# Patient Record
Sex: Male | Born: 1985
Health system: Southern US, Community
[De-identification: ages and names within clinical notes are randomized; demographics above are authoritative.]

---

## 2010-10-06 ENCOUNTER — Ambulatory Visit (INDEPENDENT_AMBULATORY_CARE_PROVIDER_SITE_OTHER): Payer: Self-pay | Admitting: Internal Medicine

## 2017-10-06 ENCOUNTER — Other Ambulatory Visit: Payer: Self-pay

## 2017-10-06 ENCOUNTER — Encounter: Payer: Self-pay | Admitting: Family Medicine

## 2017-10-06 ENCOUNTER — Other Ambulatory Visit (HOSPITAL_COMMUNITY)
Admission: RE | Admit: 2017-10-06 | Discharge: 2017-10-06 | Disposition: A | Discharge status: Home / Self Care | Payer: No Typology Code available for payment source | Source: Ambulatory Visit | Attending: Family Medicine | Admitting: Family Medicine

## 2017-10-06 ENCOUNTER — Ambulatory Visit (INDEPENDENT_AMBULATORY_CARE_PROVIDER_SITE_OTHER): Payer: No Typology Code available for payment source | Admitting: Family Medicine

## 2017-10-06 VITALS — BP 142/70 | HR 66 | Temp 98.7°F | Resp 16 | Ht 67.0 in | Wt 159.2 lb

## 2017-10-06 DIAGNOSIS — Z0000 Encounter for general adult medical examination without abnormal findings: Secondary | ICD-10-CM

## 2017-10-06 DIAGNOSIS — Z113 Encounter for screening for infections with a predominantly sexual mode of transmission: Secondary | ICD-10-CM | POA: Diagnosis not present

## 2017-10-06 DIAGNOSIS — R030 Elevated blood-pressure reading, without diagnosis of hypertension: Secondary | ICD-10-CM

## 2017-10-06 DIAGNOSIS — J40 Bronchitis, not specified as acute or chronic: Secondary | ICD-10-CM | POA: Diagnosis not present

## 2017-10-06 DIAGNOSIS — Z23 Encounter for immunization: Secondary | ICD-10-CM | POA: Diagnosis not present

## 2017-10-06 DIAGNOSIS — Z114 Encounter for screening for human immunodeficiency virus [HIV]: Secondary | ICD-10-CM | POA: Diagnosis not present

## 2017-10-06 MED ORDER — AZITHROMYCIN 250 MG PO TABS: ORAL_TABLET | ORAL | 0 refills | Status: DC

## 2017-10-06 NOTE — Progress Notes (Signed)
Patient ID: Henry Castro, male    DOB: 01/25/1986, 32 y.o.   MRN: [id number]  Chief Complaint  Patient presents with  . Annual Exam    Allergies Patient has no known allergies.  Subjective:   Henry Castro is a 32 y.o. male who presents to [facility] today.  HPI Here to establish care new patient to get his complete physical exam.  He reports that he feels like he is in a good state of health.  Exercises and eats healthy.  He does report that he has been coughing for one month. No weight loss, no night sweats, no SOB. Cough worse over the past week.  Has had an associated sore throat.  Seems to make the cough better.  He does report that the cough is worse at night.  He denies any chest pain or shortness of breath.  Denies any reflux or dyspepsia.  Has no history of asthma.  Does exercise at the gym quite a bit and is around sick contacts from time to time.  Has no history of pneumonia.  The cough has not gone away.  He does not remember when he was last vaccinated for tetanus or pertussis.  Moved from [place] in 2001.   Goes to the dentist every 6 months.  Has not had an eye exam in approximately 4 years.  Has no history of diabetes, high cholesterol or high blood pressure.  Blood pressure is elevated today.  He denies any use of over-the-counter cough medicines.  He does report that he drinks energy drinks and pre-workout enhancement supplements.  He reports he is trying to gain some weight and increase his muscle mass.  He reports that it is hard for him to gain weight and would like to bulk up.  He enjoys working out.  Enjoys time with his wife and family.  Is not currently employed because he quit his job at a [employer] plant because he did not like it and it was an hour drive.    History reviewed. No pertinent past medical history.  History reviewed. No pertinent surgical history.  Family History  Problem Relation Age of Onset  . Hyperlipidemia Mother   . Hypertension  Mother   . Pneumonia Father      Social History   Socioeconomic History  . Marital status: Married    Spouse name: None  . Number of children: None  . Years of education: None  . Highest education level: None  Social Needs  . Financial resource strain: None  . Food insecurity - worry: None  . Food insecurity - inability: None  . Transportation needs - medical: None  . Transportation needs - non-medical: None  Occupational History  . None  Tobacco Use  . Smoking status: Former Smoker    Years: 1.00  . Smokeless tobacco: Never Used  Substance and Sexual Activity  . Alcohol use: Yes    Comment: less than once a week  . Drug use: No  . Sexual activity: Yes    Partners: Female    Birth control/protection: Condom  Other Topics Concern  . 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    Live in [place], [place].    Married with 2 children, 9 and 6.    Eats all food groups.   Wear seatbelt.   Exercises by weight lifting.   Work out at [employer].  Review of Systems  Constitutional: Negative for activity change, appetite change, chills, diaphoresis, fatigue, fever and unexpected weight change.  HENT: Positive for sore throat. Negative for dental problem, mouth sores, nosebleeds, rhinorrhea, sinus pressure, sinus pain, sneezing, tinnitus, trouble swallowing and voice change.   Respiratory: Positive for cough. Negative for apnea, choking, chest tightness, shortness of breath, wheezing and stridor.   Gastrointestinal: Negative for abdominal pain, blood in stool, constipation, diarrhea, nausea and vomiting.  Genitourinary: Negative for decreased urine volume, difficulty urinating, dysuria, flank pain, frequency, genital sores, hematuria, testicular pain and urgency.  Musculoskeletal: Negative for arthralgias, back pain, gait problem and neck stiffness.  Skin: Negative for pallor and rash.  Neurological: Negative for dizziness,  tremors, syncope, weakness, light-headedness, numbness and headaches.  Hematological: Negative for adenopathy. Does not bruise/bleed easily.  Psychiatric/Behavioral: Negative for agitation, behavioral problems, dysphoric mood and sleep disturbance. The patient is not nervous/anxious.      Objective:   BP (!) 142/70 (BP Location: Left Arm, Patient Position: Sitting, Cuff Size: Normal)   Pulse 66   Temp 98.7 F (37.1 C) (Temporal)   Resp 16   Ht 5\' 7"  (1.702 m)   Wt 159 lb 4 oz (72.2 kg)   SpO2 99%   BMI 24.94 kg/m   Physical Exam  Constitutional: He is oriented to person, place, and time. He appears well-developed and well-nourished. No distress.  HENT:  Head: Normocephalic and atraumatic.  Right Ear: External ear normal.  Left Ear: External ear normal.  Nose: Nose normal.  Mouth/Throat: Oropharynx is clear and moist. No oropharyngeal exudate.  3-4 mm flesh-colored pedunculated skin lesion in right nasal canal.  Eyes: Conjunctivae and EOM are normal. Pupils are equal, round, and reactive to light. No scleral icterus.  Neck: Normal range of motion. Neck supple. No JVD present. No tracheal deviation present. No thyromegaly present.  Cardiovascular: Normal rate, regular rhythm, normal heart sounds and intact distal pulses.  No murmur heard. Pulses:      Dorsalis pedis pulses are 2+ on the right side, and 2+ on the left side.  Pulmonary/Chest: Effort normal and breath sounds normal.  Abdominal: Soft. Bowel sounds are normal. He exhibits no distension. There is no tenderness.  Musculoskeletal: Normal range of motion. He exhibits no edema.  Lymphadenopathy:    He has no cervical adenopathy.  Neurological: He is alert and oriented to person, place, and time. He displays normal reflexes. No cranial nerve deficit or sensory deficit. He exhibits normal muscle tone. Coordination normal.  Skin: Skin is warm, dry and intact. Capillary refill takes less than 2 seconds. No rash noted. He is  not diaphoretic. No erythema.  Psychiatric: He has a normal mood and affect. His behavior is normal. Judgment and thought content normal.  Vitals reviewed.    Assessment and Plan  1. Well adult exam Age-appropriate anticipatory guidance was discussed with patient today.  We did discuss his use of protein supplements and energy drinks.  It was recommended that he discontinue the supplements and the energy drinks at this time due to his blood pressure being elevated today.  We did discuss that very high levels of protein intake could be harder on renal function.  We discussed eating real food diet that is healthy and eating lean meats and proteins. - CBC with Differential/Platelet - Lipid panel - TSH - COMPLETE METABOLIC PANEL WITH GFR  2. Immunization due Vaccinations given today. - Flu Vaccine QUAD 6+ mos PF IM (Fluarix Quad PF) - Tdap vaccine greater  than or equal to 7yo IM  3. Bronchitis Due to the fact the patient has had a cough for a month, we will treat for bronchitis at this time.  Did discuss with patient need to make sure the cough resolves.  He was also told to call with any questions concerns, or worrisome symptoms. - azithromycin (ZITHROMAX) 250 MG tablet; Take two pills today and one pill a day for the next four days.  Dispense: 6 tablet; Refill: 0  4. Screening for HIV (human immunodeficiency virus) But patient requested screening for HIV, hepatitis and any sexually transmitted infections.  He denies any symptoms of these infections at this time. - HIV antibody (with reflex)  5.  Elevated blood pressure, without diagnosis of hypertension. We will plan to check UA, BMP, and CBC at this time.  Did discuss with patient I would recommend he eating a diet low in salt and to discontinue use of protein supplements and energy drinks which could be elevating blood pressure.  I told him to check his supplements and see if there were stimulant-containing or caffeine containing which  could be elevating his blood pressure. - Urinalysis, Routine w reflex microscopic Follow-up in 1 month for recheck of blood pressure. 6. Routine screening for STI (sexually transmitted infection) Testing performed today. - HIV antibody - RPR - Hepatitis panel, acute - Urine cytology ancillary only  Dentist every six months. Vision exam recommended.  Return in about 4 weeks (around 11/03/2017) for BP check. Aliene Beams, MD 10/06/2017

## 2017-10-06 NOTE — Patient Instructions (Signed)
Plan de alimentación DASH  DASH Eating Plan  DASH es la sigla en inglés de "Enfoques Alimentarios para Detener la Hipertensión" (Dietary Approaches to Stop Hypertension). El plan de alimentación DASH ha demostrado bajar la presión arterial elevada (hipertensión). También puede reducir el riesgo de diabetes tipo 2, enfermedad cardíaca y accidente cerebrovascular. Este plan también puede ayudar a adelgazar.  Consejos para seguir este plan  Pautas generales  · Evite ingerir más de 2,300 mg (miligramos) de sal (sodio) por día. Si tiene hipertensión, es posible que necesite reducir la ingesta de sodio a 1,500 mg por día.  · Limite el consumo de alcohol a no más de 1 medida por día si es mujer y no está embarazada, y 2 medidas por día si es hombre. Una medida equivale a 12 oz (355 ml) de cerveza, 5 oz (148 ml) de vino o 1½ oz (44 ml) de bebidas alcohólicas de alta graduación.  · Trabaje con su médico para mantener un peso saludable o perder peso. Pregúntele cuál es el peso recomendado para usted.  · Realice al menos 30 minutos de ejercicio que haga que se acelere su corazón (ejercicio aeróbico) la mayoría de los días de la semana. Estas actividades pueden incluir caminar, nadar o andar en bicicleta.  · Trabaje con su médico o especialista en alimentación y nutrición (nutricionista) para ajustar su plan alimentario a sus necesidades calóricas personales.  Lectura de las etiquetas de los alimentos  · Verifique en las etiquetas de los alimentos, la cantidad de sodio por porción. Elija alimentos con menos del 5 por ciento del valor diario de sodio. Generalmente, los alimentos con menos de 300 mg de sodio por porción se encuadran dentro de este plan alimentario.  · Para encontrar cereales integrales, busque la palabra "integral" como primera palabra en la lista de ingredientes.  De compras  · Compre productos en los que en su etiqueta diga: “bajo contenido de sodio” o “sin agregado de sal”.   · Compre alimentos frescos. Evite los alimentos enlatados y comidas precocidas o congeladas.  Cocción  · Evite agregar sal cuando cocine. Use hierbas o aderezos sin sal, en lugar de sal de mesa o sal marina. Consulte al médico o farmacéutico antes de usar sustitutos de la sal.  · No fría los alimentos. A la hora de cocinar los alimentos opte por hornearlos, hervirlos, grillarlos y asarlos a la parrilla.  · Cocine con aceites cardiosaludables, como oliva, canola, soja o girasol.  Planificación de las comidas    · Consuma una dieta equilibrada, que incluya lo siguiente:  ? 5 o más porciones de frutas y verduras por día. Trate de que la mitad del plato de cada comida sean frutas y verduras.  ? Hasta 6 u 8 porciones de cereales integrales por día.  ? Menos de 6 onzas de carne, aves o pescado magros por día. Una porción de 3 onzas de carne tiene casi el mismo tamaño que un mazo de cartas. Un huevo equivale a 1 onza.  ? Dos porciones de productos lácteos descremados por día.  ? Una porción de frutos secos, semillas o frijoles 5 veces por semana.  ? Grasas cardiosaludables. Las grasas saludables llamadas ácidos grasos omega-3 se encuentran en alimentos como semillas de lino y pescados de agua fría, como por ejemplo, sardinas, salmón y caballa.  · Limite la cantidad que ingiere de los siguientes alimentos:  ? Alimentos enlatados o envasados.  ? Alimentos con alto contenido de grasa trans, como alimentos fritos.  ? Alimentos con alto contenido de grasa saturada, como carne con   grasa.  ? Dulces, postres, bebidas azucaradas y otros alimentos con azúcar agregada.  ? Productos lácteos enteros.  · No le agregue sal a los alimentos antes de probarlos.  · Trate de comer al menos 2 comidas vegetarianas por semana.  · Consuma más comida casera y menos de restaurante, de bufés y comida rápida.  · Cuando coma en un restaurante, pida que preparen su comida con menos sal o, en lo posible, sin nada de sal.  ¿Qué alimentos se recomiendan?   Los alimentos enumerados a continuación no constituyen una lista completa. Hable con el nutricionista sobre las mejores opciones alimenticias para usted.  Cereales  Pan de salvado o integral. Pasta de salvado o integral. Arroz integral. Avena. Quinua. Trigo burgol. Cereales integrales y con bajo contenido de sodio. Pan pita. Galletitas de agua con bajo contenido de grasa y sodio. Tortillas de harina integral.  Verduras  Verduras frescas o congeladas (crudas, al vapor, asadas o grilladas). Jugos de tomate y verduras con bajo contenido de sodio o reducidos en sodio. Salsa y pasta de tomate con bajo contenido de sodio o reducidas en sodio. Verduras enlatadas con bajo contenido de sodio o reducidas en sodio.  Frutas  Todas las frutas frescas, congeladas o disecadas. Frutas enlatadas en jugo natural (sin agregado de azúcar).  Carne y otros alimentos proteicos  Pollo o pavo sin piel. Carne de pollo o de pavo molida. Cerdo desgrasado. Pescado y mariscos. Claras de huevo. Porotos, guisantes o lentejas secos. Frutos secos, mantequilla de frutos secos y semillas sin sal. Frijoles enlatados sin sal. Cortes de carne vacuna magra, desgrasada. Embutidos magros, con bajo contenido de sodio.  Lácteos  Leche descremada (1 %) o descremada. Quesos sin grasa, con bajo contenido de grasa o descremados. Queso blanco o ricota sin grasa, con bajo contenido de sodio. Yogur semidescremado o descremado. Queso con bajo contenido de grasa y sodio.  Grasas y aceites  Margarinas untables que no contengan grasas trans. Aceite vegetal. Mayonesa y aderezos para ensaladas livianos o con bajo contenido de grasas (reducidos en sodio). Aceite de canola, cártamo, oliva, soja y girasol. Aguacate.  Condimentos y otros alimentos  Hierbas. Especias. Mezclas de condimentos sin sal. Palomitas de maíz y pretzels sin sal. Dulces con bajo contenido de grasas.  ¿Qué alimentos no se recomiendan?   Los alimentos enumerados a continuación no constituyen una lista completa. Hable con el nutricionista sobre las mejores opciones alimenticias para usted.  Cereales  Productos de panificación hechos con grasa, como medialunas, magdalenas y algunos panes. Comidas con arroz o pasta seca listas para usar.  Verduras  Verduras con crema o fritas. Verduras en salsa de queso. Verduras enlatadas regulares (que no sean con bajo contenido de sodio o reducidas en sodio). Pasta y salsa de tomates enlatadas regulares (que no sean con bajo contenido de sodio o reducidas en sodio). Jugos de tomate y verduras regulares (que no sean con bajo contenido de sodio o reducidos en sodio). Pepinillos. Aceitunas.  Frutas  Fruta enlatada en almíbar liviano o espeso. Frutas cocidas en aceite. Frutas con salsa de crema o manteca.  Carne y otros alimentos proteicos  Cortes de carne con grasa. Costillas. Carne frita. Tocino. Salchichas. Mortadela y otras carnes procesadas. Salame. Panceta. Perros calientes (hotdogs). Salchicha de cerdo. Frutos secos y semillas con sal. Frijoles enlatados con agregado de sal. Pescado enlatado o ahumado. Huevos enteros o yemas. Pollo o pavo con piel.  Lácteos  Leche entera o al 2 %, crema y mitad leche y mitad crema. Queso crema entero   o con toda su grasa. Yogur entero o endulzado. Quesos con toda su grasa. Sustitutos de cremas no lácteas. Coberturas batidas. Quesos para untar y quesos procesados.  Grasas y aceites  Mantequilla. Margarina en barra. Manteca de cerdo. Materia grasa. Mantequilla clarificada. Grasa de panceta. Aceites tropicales como aceite de coco, palmiste o palma.  Condimentos y otros alimentos  Palomitas de maíz y pretzels con sal. Sal de cebolla, sal de ajo, sal condimentada, sal de mesa y sal marina. Salsa Worcestershire. Salsa tártara. Salsa barbacoa. Salsa teriyaki. Salsa de soja, incluso la que tiene contenido reducido de sodio. Salsa de carne. Salsas en lata y  envasadas. Salsa de pescado. Salsa de ostras. Salsa rosada. Rábano picante envasado. Kétchup. Mostaza. Saborizantes y tiernizantes para carne. Caldo en cubitos. Salsa picante y salsa tabasco. Escabeches envasados o ya preparados. Aderezos para tacos prefabricados o envasados. Salsas. Aderezos comunes para ensalada.  Dónde encontrar más información:  · Instituto Nacional del Coraz[place], los Pulmones y la Sangre (National Heart, Lung, and Blood Institute): www.nhlbi.nih.gov  · Asociación Estadounidense del [place] (American Heart Association): www.heart.org  Resumen  · El plan de alimentación DASH ha demostrado bajar la presión arterial elevada (hipertensión). También puede reducir el riesgo de diabetes tipo 2, enfermedad cardíaca y accidente cerebrovascular.  · Con el plan de alimentación DASH, deberá limitar el consumo de sal (sodio) a 2,300 mg por día. Si tiene hipertensión, es posible que necesite reducir la ingesta de sodio a 1,500 mg por día.  · Cuando siga el plan de alimentación DASH, trate de comer más frutas frescas y verduras, cereales integrales, carnes magras, lácteos descremados y grasas cardiosaludables.  · Trabaje con su médico o especialista en alimentación y nutrición (nutricionista) para ajustar su plan alimentario a sus necesidades calóricas personales.  Esta información no tiene como fin reemplazar el consejo del médico. Asegúrese de hacerle al médico cualquier pregunta que tenga.  Document Released: 07/22/2011 Document Revised: 11/22/2016 Document Reviewed: 11/22/2016  Elsevier Interactive Patient Education © 2018 Elsevier Inc.

## 2017-10-07 ENCOUNTER — Other Ambulatory Visit (HOSPITAL_COMMUNITY)
Admission: RE | Admit: 2017-10-07 | Discharge: 2017-10-07 | Disposition: A | Discharge status: Home / Self Care | Payer: No Typology Code available for payment source | Source: Other Acute Inpatient Hospital | Attending: Family Medicine | Admitting: Family Medicine

## 2017-10-07 DIAGNOSIS — I10 Essential (primary) hypertension: Secondary | ICD-10-CM | POA: Insufficient documentation

## 2017-10-07 LAB — URINALYSIS, ROUTINE W REFLEX MICROSCOPIC
Bilirubin Urine: NEGATIVE
Glucose, UA: NEGATIVE mg/dL
HGB URINE DIPSTICK: NEGATIVE
KETONES UR: NEGATIVE mg/dL
Leukocytes, UA: NEGATIVE
Nitrite: NEGATIVE
PH: 6 (ref 5.0–8.0)
PROTEIN: NEGATIVE mg/dL
Specific Gravity, Urine: 1.012 (ref 1.005–1.030)

## 2017-10-08 LAB — CBC WITH DIFFERENTIAL/PLATELET
Basophils Absolute: 42 cells/uL (ref 0–200)
Basophils Relative: 0.6 %
EOS ABS: 329 {cells}/uL (ref 15–500)
Eosinophils Relative: 4.7 %
HCT: 46.9 % (ref 38.5–50.0)
Hemoglobin: 16.6 g/dL (ref 13.2–17.1)
Lymphs Abs: 1631 cells/uL (ref 850–3900)
MCH: 32.4 pg (ref 27.0–33.0)
MCHC: 35.4 g/dL (ref 32.0–36.0)
MCV: 91.4 fL (ref 80.0–100.0)
MPV: 10.8 fL (ref 7.5–12.5)
Monocytes Relative: 13.9 %
NEUTROS PCT: 57.5 %
Neutro Abs: 4025 cells/uL (ref 1500–7800)
PLATELETS: 192 10*3/uL (ref 140–400)
RBC: 5.13 10*6/uL (ref 4.20–5.80)
RDW: 12 % (ref 11.0–15.0)
TOTAL LYMPHOCYTE: 23.3 %
WBC: 7 10*3/uL (ref 3.8–10.8)
WBCMIX: 973 {cells}/uL — AB (ref 200–950)

## 2017-10-08 LAB — COMPLETE METABOLIC PANEL WITH GFR
AG Ratio: 1.9 (calc) (ref 1.0–2.5)
ALT: 27 U/L (ref 9–46)
AST: 32 U/L (ref 10–40)
Albumin: 4.5 g/dL (ref 3.6–5.1)
Alkaline phosphatase (APISO): 97 U/L (ref 40–115)
BILIRUBIN TOTAL: 1.6 mg/dL — AB (ref 0.2–1.2)
BUN: 21 mg/dL (ref 7–25)
CALCIUM: 9.7 mg/dL (ref 8.6–10.3)
CHLORIDE: 105 mmol/L (ref 98–110)
CO2: 23 mmol/L (ref 20–32)
Creat: 0.87 mg/dL (ref 0.60–1.35)
GFR, EST AFRICAN AMERICAN: 132 mL/min/{1.73_m2} (ref >=60)
GFR, EST NON AFRICAN AMERICAN: 114 mL/min/{1.73_m2} (ref >=60)
Globulin: 2.4 g/dL (calc) (ref 1.9–3.7)
Glucose, Bld: 99 mg/dL (ref 65–99)
Potassium: 4.2 mmol/L (ref 3.5–5.3)
Sodium: 137 mmol/L (ref 135–146)
TOTAL PROTEIN: 6.9 g/dL (ref 6.1–8.1)

## 2017-10-08 LAB — HEPATITIS PANEL, ACUTE
HEP A IGM: NONREACTIVE
Hep B C IgM: NONREACTIVE
Hepatitis B Surface Ag: NONREACTIVE
Hepatitis C Ab: NONREACTIVE
SIGNAL TO CUT-OFF: 0.08 (ref <1.00)

## 2017-10-08 LAB — LIPID PANEL
Cholesterol: 198 mg/dL (ref <200)
HDL: 55 mg/dL (ref >40)
LDL Cholesterol (Calc): 123 mg/dL (calc) — ABNORMAL HIGH
NON-HDL CHOLESTEROL (CALC): 143 mg/dL — AB (ref <130)
Total CHOL/HDL Ratio: 3.6 (calc) (ref <5.0)
Triglycerides: 98 mg/dL (ref <150)

## 2017-10-08 LAB — HIV ANTIBODY (ROUTINE TESTING W REFLEX): HIV: NONREACTIVE

## 2017-10-08 LAB — RPR: RPR Ser Ql: NONREACTIVE

## 2017-10-08 LAB — TSH: TSH: 2.22 m[IU]/L (ref 0.40–4.50)

## 2017-10-10 LAB — URINE CYTOLOGY ANCILLARY ONLY
CHLAMYDIA, DNA PROBE: NEGATIVE
NEISSERIA GONORRHEA: NEGATIVE
TRICH (WINDOWPATH): NEGATIVE

## 2017-10-13 ENCOUNTER — Encounter: Payer: Self-pay | Admitting: Family Medicine

## 2017-11-10 ENCOUNTER — Other Ambulatory Visit: Payer: Self-pay

## 2017-11-10 ENCOUNTER — Encounter: Payer: Self-pay | Admitting: Family Medicine

## 2017-11-10 ENCOUNTER — Ambulatory Visit (INDEPENDENT_AMBULATORY_CARE_PROVIDER_SITE_OTHER): Payer: No Typology Code available for payment source | Admitting: Family Medicine

## 2017-11-10 VITALS — BP 120/78 | HR 70 | Temp 98.3°F | Resp 16 | Ht 67.0 in | Wt 155.0 lb

## 2017-11-10 DIAGNOSIS — R030 Elevated blood-pressure reading, without diagnosis of hypertension: Secondary | ICD-10-CM

## 2017-11-10 DIAGNOSIS — Z23 Encounter for immunization: Secondary | ICD-10-CM | POA: Diagnosis not present

## 2017-11-10 DIAGNOSIS — E7800 Pure hypercholesterolemia, unspecified: Secondary | ICD-10-CM

## 2017-11-10 NOTE — Progress Notes (Signed)
Patient ID: Henry Castro, male    DOB: August 16, 1986, 32 y.o.   MRN: [id number]  Chief Complaint  Patient presents with  . Follow-up  . Blood Pressure Check    Allergies Patient has no known allergies.  Subjective:   Henry Castro is a 32 y.o. male who presents to [facility] today.  HPI Henry Castro presents for follow-up from his complete physical exam.  When he was last seen at his initial visit for his physical he was noted to have an elevated blood pressure.  He is here to have that rechecked.  He is also here to review his lab work.  He reports he has been concerned about his cholesterol for a while because his mother has a history of high cholesterol and high blood pressure.  He is also here to get a check on his breathing.  He reports that last time he was here he was diagnosed with bronchitis and had been coughing for 2 months.  He was treated with an antibiotic.  He reports that his cough is resolved.  He has not had any subsequent coughing episodes or problems.  He denies any shortness of breath or wheezing.  He reports that he feels great.  He reports that since leaving here and our discussion he has stopped taking any workout supplements and cut down on the protein supplements.  He exercises daily and spends a lot of time lifting weights.  He reports that he is cut down on the amount of eggs that he eats and now only eats eggs 2-3 days a week, 1/day.  He has not checked his blood pressure since he has left here.  He reports he did lose 4-5 pounds because he has been eating healthier.  He does not eat a lot of processed food or fast food.  He has been watching his salt intake.   History reviewed. No pertinent past medical history.  History reviewed. No pertinent surgical history.  Family History  Problem Relation Age of Onset  . Hyperlipidemia Mother   . Hypertension Mother   . Pneumonia Father      Social History   Socioeconomic History  . Marital status:  Married    Spouse name: Not on file  . Number of children: Not on file  . Years of education: Not on file  . Highest education level: Not on file  Occupational History  . Not on file  Social Needs  . Financial resource strain: Not on file  . Food insecurity:    Worry: Not on file    Inability: Not on file  . Transportation needs:    Medical: Not on file    Non-medical: Not on file  Tobacco Use  . Smoking status: Former Smoker    Years: 1.00  . Smokeless tobacco: Never Used  Substance and Sexual Activity  . Alcohol use: Yes    Comment: less than once a week  . Drug use: No  . Sexual activity: Yes    Partners: Female    Birth control/protection: Condom  Lifestyle  . Physical activity:    Days per week: Not on file    Minutes per session: Not on file  . Stress: Not on file  Relationships  . Social connections:    Talks on phone: Not on file    Gets together: Not on file    Attends religious service: Not on file    Active member of club or organization: Not on file  Attends meetings of clubs or organizations: Not on file    Relationship status: Not on file  Other Topics Concern  . 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  Eats all food groups. Has stopped all supplements.    Live in [place], [place]    Married with 2 children, 9 and 6.    Eats all food groups.   Wear seatbelt.   Exercises by weight lifting.   Work out at [employer].    Has seven siblings; they live in [place], [place];        Review of Systems  Constitutional: Negative for appetite change, chills, fever and unexpected weight change.  HENT: Negative for trouble swallowing and voice change.   Eyes: Negative for visual disturbance.  Respiratory: Negative for cough, chest tightness, shortness of breath and wheezing.   Cardiovascular: Negative for chest pain, palpitations and leg swelling.  Gastrointestinal: Negative for abdominal pain, diarrhea,  nausea and vomiting.  Genitourinary: Negative for decreased urine volume, dysuria and frequency.  Skin: Negative for rash.  Neurological: Negative for dizziness, tremors, syncope, facial asymmetry, weakness and headaches.  Hematological: Negative for adenopathy. Does not bruise/bleed easily.     Objective:   BP 120/78 (BP Location: Left Arm, Patient Position: Sitting, Cuff Size: Normal)   Pulse 70   Temp 98.3 F (36.8 C) (Temporal)   Resp 16   Ht 5\' 7"  (1.702 m)   Wt 155 lb (70.3 kg)   SpO2 98%   BMI 24.28 kg/m   Physical Exam  Constitutional: He is oriented to person, place, and time. He appears well-developed and well-nourished.  Neck: Normal range of motion. Neck supple.  Cardiovascular: Normal rate, regular rhythm and normal heart sounds.  Pulmonary/Chest: Effort normal and breath sounds normal. He has no wheezes.  Neurological: He is alert and oriented to person, place, and time.  Skin: Skin is warm and dry.  Psychiatric: He has a normal mood and affect. His behavior is normal. Judgment and thought content normal.  Vitals reviewed.    Assessment and Plan  1. Blood pressure elevated without history of HTN Blood pressure was rechecked in office today and was within normal limits.  He was recommended to continue diet, exercise, and healthy lifestyle practices.  2. Immunization due Twinrix vaccination #1 administered today.  Follow-up for repeat shot in 2 months. - Hepatitis A hepatitis B combined vaccine IM  3. Elevated LDL cholesterol level LDL approximately 130.  Discussed with patient primary prevention LDL goal would be less than 100.  We did discuss low-cholesterol diet and healthy lifestyle/dietary choices.  He is not indicated for statin at this time.  Plan to recheck labs in 1 year.  She also requested information on glucosamine chondroitin.  He would like to try this supplement for his joints.  We did discuss today that no herbal products are regulated by the  FDA.  I did talk with him regarding glucosamine and I think that a trial of the supplement would be fine.  However I did tell him that if he has no benefit after 2-3 months of therapy that would be recommend that he discontinue it because the likelihood of benefit after that time.  Is very low. Return in about 1 year (around 11/11/2018) for CPE. Aliene Beamsachel Ivadell Gaul, MD 11/10/2017

## 2017-11-10 NOTE — Patient Instructions (Addendum)
Nurse visit in 2 months for next Twin-Rix shot.    Glucosamine tablets and capsules What is this medicine? GLUCOSAMINE (gloo KOH suh meen) is a dietary supplement. It is promoted to keep joints healthy and working smoothly. The FDA has not approved this supplement for any medical use. This supplement may be used for other purposes; ask your health care provider or pharmacist if you have questions. This medicine may be used for other purposes; ask your health care provider or pharmacist if you have questions. COMMON BRAND NAME(S): Genicin, OptiFlex-G What should I tell my health care provider before I take this medicine? They need to know if you have any of these conditions: -diabetes -kidney disease -liver disease -stomach or intestinal problems -an unusual or allergic reaction to glucosamine, other herbs, plants, medicines, foods, dyes, or preservatives -pregnant or trying to get pregnant -breast-feeding How should I use this medicine? Take this medicine by mouth with a glass of water. Follow the directions on the package labeling, or take as directed by your health care professional. Take your doses at regular intervals. If this medicine upsets your stomach, take it with food. Do not take this medicine more often than directed. Contact your pediatrician regarding the use of this medicine in children. Special care may be needed. Overdosage: If you think you have taken too much of this medicine contact a poison control center or emergency room at once. NOTE: This medicine is only for you. Do not share this medicine with others. What if I miss a dose? If you miss a dose, take it as soon as you can. If it is almost time for your next dose, take only that dose. Do not take double or extra doses. What may interact with this medicine? -warfarin This list may not describe all possible interactions. Give your health care provider a list of all the medicines, herbs, non-prescription drugs, or  dietary supplements you use. Also tell them if you smoke, drink alcohol, or use illegal drugs. Some items may interact with your medicine. What should I watch for while using this medicine? See your doctor if your symptoms do not get better or if they get worse. If you are scheduled for any medical or dental procedure, tell your healthcare provider that you are taking this medicine. You may need to stop taking this medicine before the procedure. Herbal or dietary supplements are not regulated like medicines. Rigid quality control standards are not required for dietary supplements. The purity and strength of these products can vary. The safety and effect of this dietary supplement for a certain disease or illness is not well known. This product is not intended to diagnose, treat, cure or prevent any disease. The Food and Drug Administration suggests the following to help consumers protect themselves: -Always read product labels and follow directions. -Natural does not mean a product is safe for humans to take. -Look for products that include USP after the ingredient name. This means that the manufacturer followed the standards of the [place] Pharmacopoeia. -Supplements made or sold by a nationally known food or drug company are more likely to be made under tight controls. You can write to the company for more information about how the product was made. What side effects may I notice from receiving this medicine? Side effects that you should report to your doctor or health care professional as soon as possible: -allergic reactions like skin rash, itching or hives, swelling of the face, lips, or tongue -breathing problems -constipation or diarrhea -  loss of appetite -stomach pain Side effects that usually do not require medical attention (report to your doctor or health care professional if they continue or are bothersome): -gas -headache -nausea -stomach upset This list may not describe all possible  side effects. Call your doctor for medical advice about side effects. You may report side effects to FDA at 1-800-FDA-1088. Where should I keep my medicine? Keep out of the reach of children. Store at room temperature or as directed on the package label. Protect from moisture. Throw away any unused medicine after the expiration date. NOTE: This sheet is a summary. It may not cover all possible information. If you have questions about this medicine, talk to your doctor, pharmacist, or health care provider.  2018 Elsevier/Gold Standard (2007-12-13 10:56:07)

## 2018-01-20 ENCOUNTER — Encounter: Payer: Self-pay | Admitting: Family Medicine

## 2018-02-09 ENCOUNTER — Ambulatory Visit (INDEPENDENT_AMBULATORY_CARE_PROVIDER_SITE_OTHER): Payer: No Typology Code available for payment source

## 2018-02-09 VITALS — BP 118/74 | HR 88 | Temp 98.4°F | Resp 16 | Ht 67.0 in | Wt 147.0 lb

## 2018-02-09 DIAGNOSIS — Z23 Encounter for immunization: Secondary | ICD-10-CM | POA: Diagnosis not present

## 2018-02-09 NOTE — Progress Notes (Signed)
Patient came in today for his second twin-rix vaccine. Received first vaccine on 11/10/17. Ok'ed w/ Dr.Hagler. Patient has not had a fever or sickness recently. Vitals wnl. Patient advised if he develops fever,chills,redness,swelling at injection site to call office or see medical attention at nearest urgent care or ED with verbal understanding. Vaccine given in LEFT deltoid w/ no immediate reaction seen. Patient understands he needs one more vaccine to complete series.

## 2019-11-01 ENCOUNTER — Other Ambulatory Visit: Payer: Self-pay

## 2019-11-01 ENCOUNTER — Encounter: Payer: Self-pay | Admitting: Emergency Medicine

## 2019-11-01 ENCOUNTER — Ambulatory Visit (INDEPENDENT_AMBULATORY_CARE_PROVIDER_SITE_OTHER): Payer: No Typology Code available for payment source

## 2019-11-01 ENCOUNTER — Ambulatory Visit
Admission: EM | Admit: 2019-11-01 | Discharge: 2019-11-01 | Disposition: A | Discharge status: Home / Self Care | Payer: No Typology Code available for payment source | Attending: Emergency Medicine | Admitting: Emergency Medicine

## 2019-11-01 DIAGNOSIS — M25571 Pain in right ankle and joints of right foot: Secondary | ICD-10-CM

## 2019-11-01 DIAGNOSIS — S92415A Nondisplaced fracture of proximal phalanx of left great toe, initial encounter for closed fracture: Secondary | ICD-10-CM

## 2019-11-01 DIAGNOSIS — M79671 Pain in right foot: Secondary | ICD-10-CM

## 2019-11-01 DIAGNOSIS — S92414A Nondisplaced fracture of proximal phalanx of right great toe, initial encounter for closed fracture: Secondary | ICD-10-CM

## 2019-11-01 DIAGNOSIS — M7989 Other specified soft tissue disorders: Secondary | ICD-10-CM

## 2019-11-01 MED ORDER — IBUPROFEN 800 MG PO TABS: 800.0000 mg | ORAL_TABLET | Freq: Three times a day (TID) | ORAL | 0 refills | Status: DC

## 2019-11-01 NOTE — ED Provider Notes (Addendum)
[facility]    CSN: [id number] Arrival date & time: 11/01/19  0849      History   Chief Complaint Chief Complaint  Patient presents with  . Foot Pain    HPI Henry Castro is a 34 y.o. male.   Who presented to the urgent care for complaint of right foot pain for the past 1 week the.  Reports he dropped 45 pound weight onto his foot.  He localizes the pain to the to the right foot around the great toe area.  He describes the pain as constant and achy.  He has tried OTC medications without relief.  His symptoms are made worse with ROM.  He denies similar symptoms in the past.        History reviewed. No pertinent past medical history.  There are no problems to display for this patient.   History reviewed. No pertinent surgical history.     Home Medications    Prior to Admission medications   Medication Sig Start Date End Date Taking? Authorizing Provider  azithromycin (ZITHROMAX) 250 MG tablet Take two pills today and one pill a day for the next four days. Patient not taking: Reported on 11/10/2017 10/06/17   Aliene Beams, MD  ibuprofen (ADVIL) 800 MG tablet Take 1 tablet (800 mg total) by mouth 3 (three) times daily. 11/01/19   AvegnoZachery Dakins, FNP    Family History Family History  Problem Relation Age of Onset  . Hyperlipidemia Mother   . Hypertension Mother   . Pneumonia Father     Social History Social History   Tobacco Use  . Smoking status: Former Smoker    Years: 1.00  . Smokeless tobacco: Never Used  Substance Use Topics  . Alcohol use: Yes    Comment: less than once a week  . Drug use: No     Allergies   Patient has no known allergies.   Review of Systems Review of Systems  Constitutional: Negative.   Respiratory: Negative.   Cardiovascular: Negative.   Musculoskeletal: Positive for joint swelling.       Foot pain  All other systems reviewed and are negative.    Physical Exam Triage Vital Signs ED Triage Vitals    Enc Vitals Group     BP 11/01/19 0857 133/84     Pulse Rate 11/01/19 0857 65     Resp 11/01/19 0857 18     Temp 11/01/19 0857 98.5 F (36.9 C)     Temp Source 11/01/19 0857 Oral     SpO2 11/01/19 0857 96 %     Weight 11/01/19 0858 160 lb (72.6 kg)     Height 11/01/19 0858 5\' 7"  (1.702 m)     Head Circumference --      Peak Flow --      Pain Score 11/01/19 0858 8     Pain Loc --      Pain Edu? --      Excl. in GC? --    No data found.  Updated Vital Signs BP 133/84 (BP Location: Right Arm)   Pulse 65   Temp 98.5 F (36.9 C) (Oral)   Resp 18   Ht 5\' 7"  (1.702 m)   Wt 160 lb (72.6 kg)   SpO2 96%   BMI 25.06 kg/m   Visual Acuity Right Eye Distance:   Left Eye Distance:   Bilateral Distance:    Right Eye Near:   Left Eye Near:    Bilateral Near:  Physical Exam Vitals and nursing note reviewed.  Constitutional:      General: He is not in acute distress.    Appearance: Normal appearance. He is normal weight. He is not ill-appearing, toxic-appearing or diaphoretic.  Cardiovascular:     Rate and Rhythm: Normal rate and regular rhythm.     Pulses: Normal pulses.     Heart sounds: Normal heart sounds. No murmur. No friction rub. No gallop.   Pulmonary:     Effort: Pulmonary effort is normal. No respiratory distress.     Breath sounds: Normal breath sounds. No wheezing, rhonchi or rales.  Chest:     Chest wall: No tenderness.  Musculoskeletal:        General: Swelling and tenderness present.     Right foot: Swelling and tenderness present. Normal pulse.     Left foot: Normal.     Comments: Patient is able to partially bear weight and ambulate with pain.  Surface trauma, ecchymosis and erythema present.  No lesion, ulcer or break in the skin integrity.  The right foot is without obvious asymmetry or deformity when compared to the left foot.  Unable to dorsiflex inversion/eversion.  Diminished touch sensation.  Neurological:     Mental Status: He is alert.      Sensory: Sensory deficit present.      UC Treatments / Results  Labs (all labs ordered are listed, but only abnormal results are displayed) Labs Reviewed - No data to display  EKG   Radiology DG Foot Complete Right  Result Date: 11/01/2019 CLINICAL DATA:  Dropped weight plate on foot, pain and swelling of great toe EXAM: RIGHT FOOT COMPLETE - 3+ VIEW COMPARISON:  None. FINDINGS: There is a nondisplaced, intra-articular unicondylar fracture of the lateral aspect of the great toe proximal phalanx. No other fracture or dislocation of the right foot. Joint spaces are preserved. Extensive soft tissue edema about the forefoot. IMPRESSION: Nondisplaced, intra-articular unicondylar fracture of the lateral aspect of the great toe proximal phalanx. Electronically Signed   By: Eddie Candle M.D.   On: 11/01/2019 09:18    Procedures Procedures (including critical care time)  Medications Ordered in UC Medications - No data to display  Initial Impression / Assessment and Plan / UC Course  I have reviewed the triage vital signs and the nursing notes.  Pertinent labs & imaging results that were available during my care of the patient were reviewed by me and considered in my medical decision making (see chart for details).    X-ray is positive for bony abnormality including fracture.  I have reviewed the x-ray myself and the radiologist interpretation.  I am in agreement with the radiologist interpretation.  Patient is stable at discharge. Patient was advised to follow-up with orthopedic ASAP Crutches and Cam walker was applied in office Ibuprofen 800 mg prescribed for pain management Return or go to ED for worsening symptoms  Final Clinical Impressions(s) / UC Diagnoses   Final diagnoses:  Pain in right foot  Closed nondisplaced fracture of proximal phalanx of right great toe, initial encounter     Discharge Instructions     Advised patient to follow-up with orthopedic ASAP Take pain  medication as prescribed Return or go to ED for worsening of symptoms    ED Prescriptions    Medication Sig Dispense Auth. Provider   ibuprofen (ADVIL) 800 MG tablet Take 1 tablet (800 mg total) by mouth 3 (three) times daily. 60 tablet Devanta Daniel, Darrelyn Hillock, FNP  PDMP not reviewed this encounter.   Durward Parcel, FNP 11/01/19 0940    Durward Parcel, FNP 11/01/19 6384    Durward Parcel, FNP 11/01/19 0945

## 2019-11-01 NOTE — ED Triage Notes (Signed)
Pt reports dropped a 45lb weight on r foot yesterday.  Abrasion noted R great toe.  Swelling present to r great toe and foot.  Pedal pulse present.

## 2019-11-01 NOTE — Discharge Instructions (Signed)
Advised patient to follow-up with orthopedic ASAP Take pain medication as prescribed Return or go to ED for worsening of symptoms

## 2019-11-24 ENCOUNTER — Emergency Department (HOSPITAL_COMMUNITY): Payer: Self-pay | Admitting: Anesthesiology

## 2019-11-24 ENCOUNTER — Emergency Department (HOSPITAL_COMMUNITY): Payer: Self-pay

## 2019-11-24 ENCOUNTER — Ambulatory Visit
Admission: EM | Admit: 2019-11-24 | Discharge: 2019-11-24 | Disposition: A | Discharge status: Home / Self Care | Payer: No Typology Code available for payment source

## 2019-11-24 ENCOUNTER — Encounter (HOSPITAL_COMMUNITY)
Admission: EM | Disposition: A | Discharge status: Home / Self Care | Payer: Self-pay | Source: Home / Self Care | Attending: Emergency Medicine

## 2019-11-24 ENCOUNTER — Encounter (HOSPITAL_COMMUNITY): Payer: Self-pay | Admitting: Emergency Medicine

## 2019-11-24 ENCOUNTER — Ambulatory Visit (HOSPITAL_COMMUNITY)
Admission: EM | Admit: 2019-11-24 | Discharge: 2019-11-24 | Disposition: A | Discharge status: Home / Self Care | Payer: Self-pay | Attending: Emergency Medicine | Admitting: Emergency Medicine

## 2019-11-24 ENCOUNTER — Other Ambulatory Visit: Payer: Self-pay

## 2019-11-24 DIAGNOSIS — K3533 Acute appendicitis with perforation and localized peritonitis, with abscess: Secondary | ICD-10-CM

## 2019-11-24 DIAGNOSIS — K3580 Unspecified acute appendicitis: Secondary | ICD-10-CM | POA: Insufficient documentation

## 2019-11-24 DIAGNOSIS — Z20822 Contact with and (suspected) exposure to covid-19: Secondary | ICD-10-CM | POA: Insufficient documentation

## 2019-11-24 DIAGNOSIS — Z87891 Personal history of nicotine dependence: Secondary | ICD-10-CM | POA: Insufficient documentation

## 2019-11-24 HISTORY — PX: LAPAROSCOPIC APPENDECTOMY: SHX408

## 2019-11-24 LAB — COMPREHENSIVE METABOLIC PANEL
ALT: 22 U/L (ref 0–44)
AST: 23 U/L (ref 15–41)
Albumin: 4.3 g/dL (ref 3.5–5.0)
Alkaline Phosphatase: 79 U/L (ref 38–126)
Anion gap: 9 (ref 5–15)
BUN: 23 mg/dL — ABNORMAL HIGH (ref 6–20)
CO2: 25 mmol/L (ref 22–32)
Calcium: 9.3 mg/dL (ref 8.9–10.3)
Chloride: 102 mmol/L (ref 98–111)
Creatinine, Ser: 0.89 mg/dL (ref 0.61–1.24)
GFR calc Af Amer: >60 mL/min (ref >60)
GFR calc non Af Amer: >60 mL/min (ref >60)
Glucose, Bld: 105 mg/dL — ABNORMAL HIGH (ref 70–99)
Potassium: 3.8 mmol/L (ref 3.5–5.1)
Sodium: 136 mmol/L (ref 135–145)
Total Bilirubin: 2.7 mg/dL — ABNORMAL HIGH (ref 0.3–1.2)
Total Protein: 7.5 g/dL (ref 6.5–8.1)

## 2019-11-24 LAB — CBC WITH DIFFERENTIAL/PLATELET
Abs Immature Granulocytes: 0.05 10*3/uL (ref 0.00–0.07)
Basophils Absolute: 0.1 10*3/uL (ref 0.0–0.1)
Basophils Relative: 0 %
Eosinophils Absolute: 0.1 10*3/uL (ref 0.0–0.5)
Eosinophils Relative: 1 %
HCT: 48.9 % (ref 39.0–52.0)
Hemoglobin: 16.7 g/dL (ref 13.0–17.0)
Immature Granulocytes: 0 %
Lymphocytes Relative: 11 %
Lymphs Abs: 1.7 10*3/uL (ref 0.7–4.0)
MCH: 32.3 pg (ref 26.0–34.0)
MCHC: 34.2 g/dL (ref 30.0–36.0)
MCV: 94.6 fL (ref 80.0–100.0)
Monocytes Absolute: 1.3 10*3/uL — ABNORMAL HIGH (ref 0.1–1.0)
Monocytes Relative: 8 %
Neutro Abs: 12.7 10*3/uL — ABNORMAL HIGH (ref 1.7–7.7)
Neutrophils Relative %: 80 %
Platelets: 165 10*3/uL (ref 150–400)
RBC: 5.17 MIL/uL (ref 4.22–5.81)
RDW: 12.6 % (ref 11.5–15.5)
WBC: 15.9 10*3/uL — ABNORMAL HIGH (ref 4.0–10.5)
nRBC: 0 % (ref 0.0–0.2)

## 2019-11-24 LAB — RESPIRATORY PANEL BY RT PCR (FLU A&B, COVID)
Influenza A by PCR: NEGATIVE
Influenza B by PCR: NEGATIVE
SARS Coronavirus 2 by RT PCR: NEGATIVE

## 2019-11-24 LAB — LIPASE, BLOOD: Lipase: 18 U/L (ref 11–51)

## 2019-11-24 SURGERY — APPENDECTOMY, LAPAROSCOPIC
Anesthesia: General

## 2019-11-24 MED ORDER — BUPIVACAINE-EPINEPHRINE (PF) 0.5% -1:200000 IJ SOLN
INTRAMUSCULAR | Status: DC | PRN
  Administered 2019-11-24: 30 mL via PERINEURAL

## 2019-11-24 MED ORDER — BUPIVACAINE-EPINEPHRINE (PF) 0.5% -1:200000 IJ SOLN
INTRAMUSCULAR | Status: AC
  Filled 2019-11-24: qty 30

## 2019-11-24 MED ORDER — HYDROCODONE-ACETAMINOPHEN 5-325 MG PO TABS: 1.0000 | ORAL_TABLET | Freq: Four times a day (QID) | ORAL | 0 refills | Status: DC | PRN

## 2019-11-24 MED ORDER — HYDROMORPHONE HCL 1 MG/ML IJ SOLN
0.5000 mg | Freq: Once | INTRAMUSCULAR | Status: AC
  Administered 2019-11-24: 0.5 mg via INTRAVENOUS
  Filled 2019-11-24: qty 1

## 2019-11-24 MED ORDER — MEPERIDINE HCL 50 MG/ML IJ SOLN: 6.2500 mg | INTRAMUSCULAR | Status: DC | PRN

## 2019-11-24 MED ORDER — DEXAMETHASONE SODIUM PHOSPHATE 10 MG/ML IJ SOLN
INTRAMUSCULAR | Status: DC | PRN
  Administered 2019-11-24: 5 mg via INTRAVENOUS

## 2019-11-24 MED ORDER — MIDAZOLAM HCL 5 MG/5ML IJ SOLN
INTRAMUSCULAR | Status: DC | PRN
  Administered 2019-11-24: 2 mg via INTRAVENOUS

## 2019-11-24 MED ORDER — ROCURONIUM BROMIDE 10 MG/ML (PF) SYRINGE
PREFILLED_SYRINGE | INTRAVENOUS | Status: DC | PRN
  Administered 2019-11-24: 40 mg via INTRAVENOUS

## 2019-11-24 MED ORDER — LIDOCAINE 2% (20 MG/ML) 5 ML SYRINGE
INTRAMUSCULAR | Status: AC
  Filled 2019-11-24: qty 5

## 2019-11-24 MED ORDER — ROCURONIUM BROMIDE 10 MG/ML (PF) SYRINGE
PREFILLED_SYRINGE | INTRAVENOUS | Status: AC
  Filled 2019-11-24: qty 10

## 2019-11-24 MED ORDER — OXYCODONE HCL 5 MG/5ML PO SOLN
ORAL | Status: AC
  Filled 2019-11-24: qty 5

## 2019-11-24 MED ORDER — PHENYLEPHRINE HCL (PRESSORS) 10 MG/ML IV SOLN
INTRAVENOUS | Status: DC | PRN
  Administered 2019-11-24 (×2): 80 ug via INTRAVENOUS
  Administered 2019-11-24: 40 ug via INTRAVENOUS

## 2019-11-24 MED ORDER — LACTATED RINGERS IV SOLN: INTRAVENOUS | Status: DC | PRN

## 2019-11-24 MED ORDER — SODIUM CHLORIDE 0.9 % IV SOLN
2.0000 g | Freq: Two times a day (BID) | INTRAVENOUS | Status: DC
  Administered 2019-11-24: 2 g via INTRAVENOUS
  Filled 2019-11-24 (×5): qty 2

## 2019-11-24 MED ORDER — ACETAMINOPHEN 10 MG/ML IV SOLN
INTRAVENOUS | Status: AC
  Filled 2019-11-24: qty 100

## 2019-11-24 MED ORDER — SODIUM CHLORIDE 0.9 % IR SOLN
Status: DC | PRN
  Administered 2019-11-24: 3000 mL

## 2019-11-24 MED ORDER — HYDROMORPHONE HCL 1 MG/ML IJ SOLN: 0.2500 mg | INTRAMUSCULAR | Status: DC | PRN

## 2019-11-24 MED ORDER — MIDAZOLAM HCL 2 MG/2ML IJ SOLN
INTRAMUSCULAR | Status: AC
  Filled 2019-11-24: qty 2

## 2019-11-24 MED ORDER — SODIUM CHLORIDE 0.9 % IV SOLN: INTRAVENOUS | Status: DC

## 2019-11-24 MED ORDER — SUGAMMADEX SODIUM 200 MG/2ML IV SOLN
INTRAVENOUS | Status: DC | PRN
  Administered 2019-11-24: 150 mg via INTRAVENOUS
  Administered 2019-11-24: 50 mg via INTRAVENOUS

## 2019-11-24 MED ORDER — SUCCINYLCHOLINE CHLORIDE 200 MG/10ML IV SOSY
PREFILLED_SYRINGE | INTRAVENOUS | Status: AC
  Filled 2019-11-24: qty 10

## 2019-11-24 MED ORDER — IOHEXOL 300 MG/ML  SOLN
100.0000 mL | Freq: Once | INTRAMUSCULAR | Status: AC | PRN
  Administered 2019-11-24: 100 mL via INTRAVENOUS

## 2019-11-24 MED ORDER — 0.9 % SODIUM CHLORIDE (POUR BTL) OPTIME
TOPICAL | Status: DC | PRN
  Administered 2019-11-24: 1000 mL

## 2019-11-24 MED ORDER — DEXAMETHASONE SODIUM PHOSPHATE 10 MG/ML IJ SOLN
INTRAMUSCULAR | Status: AC
  Filled 2019-11-24: qty 1

## 2019-11-24 MED ORDER — LIDOCAINE HCL (CARDIAC) PF 100 MG/5ML IV SOSY
PREFILLED_SYRINGE | INTRAVENOUS | Status: DC | PRN
  Administered 2019-11-24: 100 mg via INTRATRACHEAL

## 2019-11-24 MED ORDER — FENTANYL CITRATE (PF) 250 MCG/5ML IJ SOLN
INTRAMUSCULAR | Status: AC
  Filled 2019-11-24: qty 5

## 2019-11-24 MED ORDER — ONDANSETRON HCL 4 MG/2ML IJ SOLN
INTRAMUSCULAR | Status: AC
  Filled 2019-11-24: qty 2

## 2019-11-24 MED ORDER — ONDANSETRON HCL 4 MG/2ML IJ SOLN
INTRAMUSCULAR | Status: DC | PRN
  Administered 2019-11-24: 4 mg via INTRAVENOUS

## 2019-11-24 MED ORDER — PROPOFOL 10 MG/ML IV BOLUS
INTRAVENOUS | Status: AC
  Filled 2019-11-24: qty 40

## 2019-11-24 MED ORDER — LACTATED RINGERS IV SOLN: INTRAVENOUS | Status: DC

## 2019-11-24 MED ORDER — PROMETHAZINE HCL 25 MG/ML IJ SOLN: 6.2500 mg | INTRAMUSCULAR | Status: DC | PRN

## 2019-11-24 MED ORDER — SUCCINYLCHOLINE CHLORIDE 200 MG/10ML IV SOSY
PREFILLED_SYRINGE | INTRAVENOUS | Status: DC | PRN
  Administered 2019-11-24: 120 mg via INTRAVENOUS

## 2019-11-24 MED ORDER — PROPOFOL 10 MG/ML IV BOLUS
INTRAVENOUS | Status: DC | PRN
  Administered 2019-11-24: 200 mg via INTRAVENOUS

## 2019-11-24 MED ORDER — ONDANSETRON HCL 4 MG/2ML IJ SOLN
4.0000 mg | Freq: Once | INTRAMUSCULAR | Status: AC
  Administered 2019-11-24: 12:00:00 4 mg via INTRAVENOUS
  Filled 2019-11-24: qty 2

## 2019-11-24 MED ORDER — ACETAMINOPHEN 10 MG/ML IV SOLN
1000.0000 mg | Freq: Once | INTRAVENOUS | Status: AC
  Administered 2019-11-24: 1000 mg via INTRAVENOUS

## 2019-11-24 MED ORDER — FENTANYL CITRATE (PF) 250 MCG/5ML IJ SOLN
INTRAMUSCULAR | Status: DC | PRN
  Administered 2019-11-24 (×3): 50 ug via INTRAVENOUS
  Administered 2019-11-24: 100 ug via INTRAVENOUS

## 2019-11-24 MED ORDER — SODIUM CHLORIDE 0.9 % IV BOLUS
1000.0000 mL | Freq: Once | INTRAVENOUS | Status: AC
  Administered 2019-11-24: 1000 mL via INTRAVENOUS

## 2019-11-24 MED ORDER — AMOXICILLIN-POT CLAVULANATE 875-125 MG PO TABS: 1.0000 | ORAL_TABLET | Freq: Two times a day (BID) | ORAL | 0 refills | Status: AC

## 2019-11-24 SURGICAL SUPPLY — 37 items
CHLORAPREP W/TINT 26 (MISCELLANEOUS) ×3 IMPLANT
CLOTH BEACON ORANGE TIMEOUT ST (SAFETY) ×3 IMPLANT
COVER WAND RF STERILE (DRAPES) ×3 IMPLANT
CUTTER FLEX LINEAR 45M (STAPLE) ×3 IMPLANT
DERMABOND ADVANCED (GAUZE/BANDAGES/DRESSINGS) ×2
DERMABOND ADVANCED .7 DNX12 (GAUZE/BANDAGES/DRESSINGS) ×1 IMPLANT
ELECT REM PT RETURN 9FT ADLT (ELECTROSURGICAL) ×3
ELECTRODE REM PT RTRN 9FT ADLT (ELECTROSURGICAL) ×1 IMPLANT
GLOVE BIO SURGEON STRL SZ7.5 (GLOVE) ×3 IMPLANT
GLOVE BIOGEL M 6.5 STRL (GLOVE) ×3 IMPLANT
GLOVE BIOGEL PI IND STRL 6.5 (GLOVE) ×1 IMPLANT
GLOVE BIOGEL PI INDICATOR 6.5 (GLOVE) ×2
GLOVE INDICATOR 7.0 STRL GRN (GLOVE) ×3 IMPLANT
GOWN STRL REUS W/ TWL LRG LVL3 (GOWN DISPOSABLE) ×3 IMPLANT
GOWN STRL REUS W/TWL LRG LVL3 (GOWN DISPOSABLE) ×6
IV NS IRRIG 3000ML ARTHROMATIC (IV SOLUTION) ×3 IMPLANT
KIT TURNOVER KIT A (KITS) ×3 IMPLANT
MANIFOLD NEPTUNE II (INSTRUMENTS) ×3 IMPLANT
NEEDLE HYPO 22GX1.5 SAFETY (NEEDLE) ×3 IMPLANT
NEEDLE INSUFFLATION 14GA 120MM (NEEDLE) ×3 IMPLANT
NS IRRIG 1000ML POUR BTL (IV SOLUTION) ×3 IMPLANT
PACK LAP CHOLE LZT030E (CUSTOM PROCEDURE TRAY) ×3 IMPLANT
PAD ARMBOARD 7.5X6 YLW CONV (MISCELLANEOUS) ×3 IMPLANT
PENCIL HANDSWITCHING (ELECTRODE) ×3 IMPLANT
RELOAD 45 VASCULAR/THIN (ENDOMECHANICALS) IMPLANT
RELOAD STAPLE TA45 3.5 REG BLU (ENDOMECHANICALS) ×3 IMPLANT
SET TUBE IRRIG SUCTION NO TIP (IRRIGATION / IRRIGATOR) ×3 IMPLANT
SET TUBE SMOKE EVAC HIGH FLOW (TUBING) ×3 IMPLANT
SHEARS HARMONIC ACE PLUS 36CM (ENDOMECHANICALS) ×3 IMPLANT
SUT MNCRL AB 4-0 PS2 18 (SUTURE) ×6 IMPLANT
SUT VICRYL 0 UR6 27IN ABS (SUTURE) ×3 IMPLANT
TROCAR ENDO BLADELESS 12MM (ENDOMECHANICALS) ×3 IMPLANT
TROCAR XCEL NON-BLD 5MMX100MML (ENDOMECHANICALS) ×6 IMPLANT
TUBE CONNECTING 12'X1/4 (SUCTIONS) ×1
TUBE CONNECTING 12X1/4 (SUCTIONS) ×2 IMPLANT
WARMER LAPAROSCOPE (MISCELLANEOUS) ×3 IMPLANT
YANKAUER SUCT 12FT TUBE ARGYLE (SUCTIONS) ×3 IMPLANT

## 2019-11-24 NOTE — ED Provider Notes (Signed)
[facility] EMERGENCY DEPARTMENT Provider Note   CSN: [id number] Arrival date & time: 11/24/19  1040     History Chief Complaint  Patient presents with  . Abdominal Pain    Henry Castro is a 34 y.o. male.  Patient with onset of right lower quadrant lower abdominal pain on Thursday morning.  Associated with some nausea no vomiting.  Nausea is now resolved.  Had a loose bowel movement today.  Pain is gotten worse in the last several hours.  Patient did have a fever to 102 last night.  Patient's had no prior abdominal surgery before.  No upper respiratory symptoms.  No cough or congestion.        History reviewed. No pertinent past medical history.  There are no problems to display for this patient.   History reviewed. No pertinent surgical history.     Family History  Problem Relation Age of Onset  . Hyperlipidemia Mother   . Hypertension Mother   . Pneumonia Father     Social History   Tobacco Use  . Smoking status: Former Smoker    Years: 1.00  . Smokeless tobacco: Never Used  Substance Use Topics  . Alcohol use: Yes    Comment: less than once a week  . Drug use: No    Home Medications Prior to Admission medications   Medication Sig Start Date End Date Taking? Authorizing Provider  azithromycin (ZITHROMAX) 250 MG tablet Take two pills today and one pill a day for the next four days. Patient not taking: Reported on 11/10/2017 10/06/17   Aliene Beams, MD  ibuprofen (ADVIL) 800 MG tablet Take 1 tablet (800 mg total) by mouth 3 (three) times daily. Patient not taking: Reported on 11/24/2019 11/01/19   Durward Parcel, FNP    Allergies    Patient has no known allergies.  Review of Systems   Review of Systems  Constitutional: Negative for chills and fever.  HENT: Negative for congestion, rhinorrhea and sore throat.   Eyes: Negative for visual disturbance.  Respiratory: Negative for cough and shortness of breath.   Cardiovascular: Negative for chest  pain and leg swelling.  Gastrointestinal: Positive for abdominal pain, diarrhea and nausea. Negative for vomiting.  Genitourinary: Negative for dysuria.  Musculoskeletal: Negative for back pain and neck pain.  Skin: Negative for rash.  Neurological: Negative for dizziness, light-headedness and headaches.  Hematological: Does not bruise/bleed easily.  Psychiatric/Behavioral: Negative for confusion.    Physical Exam Updated Vital Signs BP 132/68   Pulse 100   Temp 98.8 F (37.1 C) (Oral)   Resp 16   Ht 1.702 m (5\' 7" )   Wt 72.6 kg   SpO2 98%   BMI 25.06 kg/m   Physical Exam Vitals and nursing note reviewed.  Constitutional:      Appearance: Normal appearance. He is well-developed.  HENT:     Head: Normocephalic and atraumatic.  Eyes:     Extraocular Movements: Extraocular movements intact.     Conjunctiva/sclera: Conjunctivae normal.     Pupils: Pupils are equal, round, and reactive to light.  Cardiovascular:     Rate and Rhythm: Normal rate and regular rhythm.     Heart sounds: No murmur.  Pulmonary:     Effort: Pulmonary effort is normal. No respiratory distress.     Breath sounds: Normal breath sounds.  Abdominal:     Palpations: Abdomen is soft.     Tenderness: There is abdominal tenderness. There is guarding.     Comments:  Tenderness right lower quadrant with some guarding.  Musculoskeletal:        General: Normal range of motion.     Cervical back: Normal range of motion and neck supple.  Skin:    General: Skin is warm and dry.  Neurological:     General: No focal deficit present.     Mental Status: He is alert and oriented to person, place, and time.     ED Results / Procedures / Treatments   Labs (all labs ordered are listed, but only abnormal results are displayed) Labs Reviewed  COMPREHENSIVE METABOLIC PANEL - Abnormal; Notable for the following components:      Result Value   Glucose, Bld 105 (*)    BUN 23 (*)    Total Bilirubin 2.7 (*)    All  other components within normal limits  CBC WITH DIFFERENTIAL/PLATELET - Abnormal; Notable for the following components:   WBC 15.9 (*)    Neutro Abs 12.7 (*)    Monocytes Absolute 1.3 (*)    All other components within normal limits  RESPIRATORY PANEL BY RT PCR (FLU A&B, COVID)  LIPASE, BLOOD  URINALYSIS, ROUTINE W REFLEX MICROSCOPIC    EKG None  Radiology CT Abdomen Pelvis W Contrast  Result Date: 11/24/2019 CLINICAL DATA:  Right lower quadrant pain EXAM: CT ABDOMEN AND PELVIS WITH CONTRAST TECHNIQUE: Multidetector CT imaging of the abdomen and pelvis was performed using the standard protocol following bolus administration of intravenous contrast. CONTRAST:  186mL OMNIPAQUE IOHEXOL 300 MG/ML  SOLN COMPARISON:  None. FINDINGS: Lower chest: No acute abnormality. Hepatobiliary: No focal liver abnormality is seen. No gallstones, gallbladder wall thickening, or biliary dilatation. Pancreas: Unremarkable Spleen: Unremarkable. Adrenals/Urinary Tract: Unremarkable. Stomach/Bowel: Appendix is dilated. There is a 1.1 x 0.5 cm appendicolith distally. Beyond this, there is a 1.1 x 1 x 0.8 cm fluid-filled area, which may reflect inflamed and distended distal appendix or a small contained perforation (series 2, image 60). There is infiltration of the surrounding mesentery with small volume free fluid. Bowel is otherwise normal in caliber. Vascular/Lymphatic: No significant vascular findings are present. No enlarged abdominal or pelvic lymph nodes. Reproductive: Unremarkable. Other: None. Musculoskeletal: No acute osseous abnormality. Chronic left spondylolysis at L5. IMPRESSION: Acute appendicitis with distal obstructing appendicolith. The tip of the appendix is either distended or there may be a small contained perforation/abscess. These results were called by telephone at the time of interpretation on 11/24/2019 at 1:00 pm to provider Fredia Sorrow , who verbally acknowledged these results. Electronically  Signed   By: Macy Mis M.D.   On: 11/24/2019 13:01    Procedures Procedures (including critical care time)  Medications Ordered in ED Medications  0.9 %  sodium chloride infusion ( Intravenous New Bag/Given 11/24/19 1255)  cefoTEtan (CEFOTAN) 2 g in sodium chloride 0.9 % 100 mL IVPB (2 g Intravenous New Bag/Given 11/24/19 1314)  sodium chloride 0.9 % bolus 1,000 mL (0 mLs Intravenous Stopped 11/24/19 1317)  ondansetron (ZOFRAN) injection 4 mg (4 mg Intravenous Given 11/24/19 1138)  HYDROmorphone (DILAUDID) injection 0.5 mg (0.5 mg Intravenous Given 11/24/19 1138)  iohexol (OMNIPAQUE) 300 MG/ML solution 100 mL (100 mLs Intravenous Contrast Given 11/24/19 1219)    ED Course  I have reviewed the triage vital signs and the nursing notes.  Pertinent labs & imaging results that were available during my care of the patient were reviewed by me and considered in my medical decision making (see chart for details).    MDM Rules/Calculators/A&P  CT scan confirms appendicitis.  With perhaps a small abscess perforation about 1 x 1 cm.  Covid testing ordered.  Contacted on-call general surgery who has seen the patient and ordered antibiotics and preparing to take him to the operating room.    Final Clinical Impression(s) / ED Diagnoses Final diagnoses:  Acute appendicitis with perforation, localized peritonitis, and abscess, unspecified whether gangrene present    Rx / DC Orders ED Discharge Orders    None       Vanetta Mulders, MD 11/24/19 1334

## 2019-11-24 NOTE — Anesthesia Preprocedure Evaluation (Addendum)
Anesthesia Evaluation  Patient identified by MRN, date of birth, ID band Patient awake    Reviewed: Allergy & Precautions, NPO status , Patient's Chart, lab work & pertinent test results  Airway Mallampati: II  TM Distance: >3 FB Neck ROM: Full    Dental no notable dental hx. (+) Teeth Intact   Pulmonary former smoker,    Pulmonary exam normal breath sounds clear to auscultation       Cardiovascular Exercise Tolerance: Good Normal cardiovascular exam Rhythm:Regular Rate:Normal     Neuro/Psych    GI/Hepatic negative GI ROS, (+)     substance abuse  alcohol use,   Endo/Other  negative endocrine ROS  Renal/GU negative Renal ROS     Musculoskeletal   Abdominal   Peds  Hematology   Anesthesia Other Findings   Reproductive/Obstetrics                            Anesthesia Physical Anesthesia Plan  ASA: II and emergent  Anesthesia Plan: General   Post-op Pain Management:    Induction: Intravenous  PONV Risk Score and Plan: 4 or greater and Ondansetron, Dexamethasone and Midazolam  Airway Management Planned: Oral ETT  Additional Equipment:   Intra-op Plan:   Post-operative Plan:   Informed Consent: I have reviewed the patients History and Physical, chart, labs and discussed the procedure including the risks, benefits and alternatives for the proposed anesthesia with the patient or authorized representative who has indicated his/her understanding and acceptance.     Dental advisory given  Plan Discussed with: Surgeon  Anesthesia Plan Comments:        Anesthesia Quick Evaluation

## 2019-11-24 NOTE — ED Triage Notes (Signed)
Pt presents with co/ lower abdominal pain that radiates to the right, pt states he had fever of 102 last night and has had diarrhea yet feels bloated. Pt encouraged to go to ED for higher level of care. Pt agreeable

## 2019-11-24 NOTE — Discharge Instructions (Signed)
Laparoscopic Appendectomy, Adult ° °A laparoscopic appendectomy is a surgery to take out the appendix. The appendix is a finger-like structure that is attached to the large intestine. In this surgery, the appendix is removed through three small incisions with the help of a thin, lighted tube that has a camera (laparoscope). °This procedure may be done to prevent an inflamed appendix from bursting (rupturing). It may also be done to treat the infection from an appendix that has already ruptured. It is usually done right after inflammation of the appendix (appendicitis) is diagnosed. This is a minimally invasive surgery. It usually results in less pain, fewer problems, and a quicker recovery than surgery done through a large incision. °Tell a health care provider about: °· Any allergies you have. °· All medicines you are taking, including vitamins, herbs, eye drops, creams, and over-the-counter medicines. °· Use of steroids (by mouth or creams). °· Any problems you or family members have had with anesthetic medicines. °· Any blood disorders you have. °· Any surgeries you have had. °· Any medical conditions you have. °· Whether you are pregnant or may be pregnant. °What are the risks? °Generally, this is a safe procedure. However, problems may occur, including: °· Infection. °· Bleeding. °· Allergic reactions to medicines. °· Damage to other structures or organs. °· The formation of pus (abscesses). °· Long-lasting pain or scarring at the incision sites or inside the abdomen. °· Blood clots in the legs. °What happens before the procedure? °Eating and drinking restrictions °Follow instructions from your health care provider about eating and drinking restrictions. You may be asked not to eat or drink as soon as the diagnosis of appendicitis is made. °Medicines °Ask your health care provider about: °· Changing or stopping your regular medicines. This is especially important if you are taking diabetes medicines or blood  thinners. °· Taking medicines such as aspirin and ibuprofen. These medicines can thin your blood. Do not take these medicines unless your health care provider tells you to take them. °· Taking over-the-counter medicines, vitamins, herbs, and supplements. °General instructions °· Plan to have someone take you home from the hospital. °· If you will be going home right after the procedure, plan to have someone with you for 24 hours. °· You may be given antibiotic medicine to help prevent infection or to treat existing inflammation or infection. °· Ask your health care provider how your surgical site will be marked or identified. °· Ask your health care provider what steps will be taken to help prevent infection. These may include: °? Removing hair at the surgery site. °? Washing skin with a germ-killing soap. °? Taking antibiotic medicine. °What happens during the procedure? ° °· An IV will be inserted into one of your veins. °· You will be given one or more of the following: °? A medicine to help you relax (sedative). °? A medicine to numb the area (local anesthetic). °? A medicine to make you fall asleep (general anesthetic). °· A thin, flexible tube (catheter) may be put into your bladder to drain urine. °· A tube may be passed through your nose and into your stomach (NG tube, or nasogastric tube) to drain any stomach contents. °· Your surgeon will make three small incisions near your belly button (navel). °· Air-like gas will be used to fill your abdomen. The gas will make your abdomen expand. This helps the surgeon see clearly and gives him or her more room to work. °· A laparoscope will be passed through one   of the incisions.  Other long, thin surgical instruments will be passed through the other incisions.  The appendix will be located and removed through one of the incisions.  The abdomen may be washed out to remove bacteria.  The incisions will be closed with stitches (sutures), staples, or adhesive  strips.  A bandage (dressing) may be used to cover the incisions.  If a tube was inserted into your bladder or stomach, it will be removed. The procedure may vary among health care providers and hospitals. What happens after the procedure?  Your blood pressure, heart rate, breathing rate, and blood oxygen level will be monitored until you leave the hospital.  You will be given medicines as needed to control pain.  Do not drive for 24 hours if you were given a sedative during your procedure.  If your appendix did not rupture, you may be able to go home the same day after your surgery.  If your appendix ruptured: ? You will get antibiotic medicine through an IV line. ? You may be sent home with a temporary drain. Summary  A laparoscopic appendectomy is a surgery to take out the appendix. The appendix is removed through three small incisions with the help of a thin, lighted tube that has a camera.  This is a safe procedure, but there are some risks, including bleeding, infection, allergic reaction to medicines, or damage to other organs.  You may be asked not to eat or drink as soon as a diagnosis of appendicitis is made.  After the procedure, your blood pressure, heart rate, breathing rate, and blood oxygen level will be monitored until you leave the hospital. This information is not intended to replace advice given to you by your health care provider. Make sure you discuss any questions you have with your health care provider. Document Revised: 02/02/2018 Document Reviewed: 02/02/2018 Elsevier Patient Education  2020 [employer]  General Anesthesia, Adult, Care After This sheet gives you information about how to care for yourself after your procedure. Your health care provider may also give you more specific instructions. If you have problems or questions, contact your health care provider. What can I expect after the procedure? After the procedure, the following side effects are  common:  Pain or discomfort at the IV site.  Nausea.  Vomiting.  Sore throat.  Trouble concentrating.  Feeling cold or chills.  Weak or tired.  Sleepiness and fatigue.  Soreness and body aches. These side effects can affect parts of the body that were not involved in surgery. Follow these instructions at home:  For at least 24 hours after the procedure:  Have a responsible adult stay with you. It is important to have someone help care for you until you are awake and alert.  Rest as needed.  Do not: ? Participate in activities in which you could fall or become injured. ? Drive. ? Use heavy machinery. ? Drink alcohol. ? Take sleeping pills or medicines that cause drowsiness. ? Make important decisions or sign legal documents. ? Take care of children on your own. Eating and drinking  Follow any instructions from your health care provider about eating or drinking restrictions.  When you feel hungry, start by eating small amounts of foods that are soft and easy to digest (bland), such as toast. Gradually return to your regular diet.  Drink enough fluid to keep your urine pale yellow.  If you vomit, rehydrate by drinking water, juice, or clear broth. General instructions  If you  have sleep apnea, surgery and certain medicines can increase your risk for breathing problems. Follow instructions from your health care provider about wearing your sleep device: ? Anytime you are sleeping, including during daytime naps. ? While taking prescription pain medicines, sleeping medicines, or medicines that make you drowsy.  Return to your normal activities as told by your health care provider. Ask your health care provider what activities are safe for you.  Take over-the-counter and prescription medicines only as told by your health care provider.  If you smoke, do not smoke without supervision.  Keep all follow-up visits as told by your health care provider. This is  important. Contact a health care provider if:  You have nausea or vomiting that does not get better with medicine.  You cannot eat or drink without vomiting.  You have pain that does not get better with medicine.  You are unable to pass urine.  You develop a skin rash.  You have a fever.  You have redness around your IV site that gets worse. Get help right away if:  You have difficulty breathing.  You have chest pain.  You have blood in your urine or stool, or you vomit blood. Summary  After the procedure, it is common to have a sore throat or nausea. It is also common to feel tired.  Have a responsible adult stay with you for the first 24 hours after general anesthesia. It is important to have someone help care for you until you are awake and alert.  When you feel hungry, start by eating small amounts of foods that are soft and easy to digest (bland), such as toast. Gradually return to your regular diet.  Drink enough fluid to keep your urine pale yellow.  Return to your normal activities as told by your health care provider. Ask your health care provider what activities are safe for you. This information is not intended to replace advice given to you by your health care provider. Make sure you discuss any questions you have with your health care provider. Document Revised: 08/05/2017 Document Reviewed: 03/18/2017 Elsevier Patient Education  2020 [employer]  Tissue Adhesive Wound Care Some cuts and wounds can be closed with skin glue (tissue adhesive). Skin glue holds the skin together and helps your wound heal faster. Skin glue goes away on its own as your wound gets better. Follow these instructions at home:  Wound care  Showers are allowed 24 hours after treatment. Do not soak the wound in water. Do not take baths, swim, or use hot tubs. Do not use soaps or creams on your wound.  If a bandage (dressing) was put on the wound: ? Wash your hands with soap and water  before you change your bandage. ? Change the bandage as often as told by your doctor. ? Leave skin glue in place. It will fall off on its own after 7-10 days. ? Keep the bandage dry.  Do not scratch, rub, or pick at the skin glue.  Do not put tape over the skin glue. The skin glue could come off when you take the tape off.  Protect the wound from another injury.  Protect the wound from sun and tanning beds. General instructions  Take over-the-counter and prescription medicines only as told by your doctor.  Keep all follow-up visits as told by your doctor. This is important. Get help right away if:  Your wound is red, puffy (swollen), hot, or tender.  You get a rash after  the glue is put on.  You have more pain in the wound.  You have a red streak going away from the wound.  You have yellowish-white fluid (pus) coming from the wound.  You have more bleeding.  You have a fever.  You have chills and you start to shake.  You notice a bad smell coming from the wound.  Your wound or skin glue breaks open. This information is not intended to replace advice given to you by your health care provider. Make sure you discuss any questions you have with your health care provider. Document Revised: 07/15/2017 Document Reviewed: 06/25/2016 Elsevier Patient Education  2020 [employer].

## 2019-11-24 NOTE — ED Notes (Signed)
Query to lab re covid   They report results in 13 minutes

## 2019-11-24 NOTE — H&P (Signed)
Patient ID: Henry Castro, male   DOB: 09-Feb-1986, 34 y.o.   MRN: [id number]  Chief Complaint:  RLQ pain  History of Present Illness Henry Castro is a 34 y.o. male with 2 day h/o abdominal pain, beginning peri-umbilical and now in the RLQ.  Fever last night, no chills.  Loose BM this AM.   Recent right toe fracture.  Last ate yesterday evening 20:00.  Denies vomiting, had nausea with onset of pain. Denies melena, hematochezia, constipation.    Past Medical History History reviewed. No pertinent past medical history.    History reviewed. No pertinent surgical history.  No Known Allergies  Current Facility-Administered Medications  Medication Dose Route Frequency Provider Last Rate Last Admin  . 0.9 %  sodium chloride infusion   Intravenous Continuous Vanetta Mulders, MD 75 mL/hr at 11/24/19 1255 New Bag at 11/24/19 1255  . cefoTEtan (CEFOTAN) 2 g in sodium chloride 0.9 % 100 mL IVPB  2 g Intravenous Q12H Campbell Lerner, MD       Current Outpatient Medications  Medication Sig Dispense Refill  . azithromycin (ZITHROMAX) 250 MG tablet Take two pills today and one pill a day for the next four days. (Patient not taking: Reported on 11/10/2017) 6 tablet 0  . ibuprofen (ADVIL) 800 MG tablet Take 1 tablet (800 mg total) by mouth 3 (three) times daily. (Patient not taking: Reported on 11/24/2019) 60 tablet 0    Family History Family History  Problem Relation Age of Onset  . Hyperlipidemia Mother   . Hypertension Mother   . Pneumonia Father       Social History Social History   Tobacco Use  . Smoking status: Former Smoker    Years: 1.00  . Smokeless tobacco: Never Used  Substance Use Topics  . Alcohol use: Yes    Comment: less than once a week  . Drug use: No        Review of Systems  Constitutional: Positive for fever and malaise/fatigue. Negative for chills.  HENT: Negative.   Eyes: Negative.   Respiratory: Negative for cough.   Cardiovascular: Negative.    Gastrointestinal: Negative for heartburn.  Genitourinary: Negative for dysuria, frequency, hematuria and urgency.  Musculoskeletal: Negative.   Skin: Negative.   Neurological: Negative.   Endo/Heme/Allergies: Negative.       Physical Exam Blood pressure 132/68, pulse 100, temperature 98.8 F (37.1 C), temperature source Oral, resp. rate 16, height 5\' 7"  (1.702 m), weight 72.6 kg, SpO2 98 %. Last Weight  Most recent update: 11/24/2019 10:59 AM   Weight  72.6 kg (160 lb)            CONSTITUTIONAL: Well developed, and nourished, appropriately responsive and aware without distress.   EYES: Sclera non-icteric.   EARS, NOSE, MOUTH AND THROAT: Mask worn.    Hearing is intact to voice.  NECK: Trachea is midline, and there is no jugular venous distension.  LYMPH NODES:  Lymph nodes in the neck are not enlarged. RESPIRATORY:  Lungs are clear, and breath sounds are equal bilaterally. Normal respiratory effort without pathologic use of accessory muscles. CARDIOVASCULAR: Heart is regular in rate and rhythm. GI: The abdomen is tender with guarding in RLQ, otherwise soft, nontender, and nondistended. There were no palpable masses. I did not appreciate hepatosplenomegaly. There were normal bowel sounds. MUSCULOSKELETAL:  Symmetrical muscle tone appreciated in all four extremities.    SKIN: Skin turgor is normal. No pathologic skin lesions appreciated.  NEUROLOGIC:  Motor and sensation appear grossly normal.  Cranial nerves are grossly without defect. PSYCH:  Alert and oriented to person, place and time. Affect is appropriate for situation.  Data Reviewed I have personally reviewed what is currently available of the patient's imaging, recent labs and medical records.   Labs:  CBC Latest Ref Rng & Units 11/24/2019 10/07/2017  WBC 4.0 - 10.5 K/uL 15.9(H) 7.0  Hemoglobin 13.0 - 17.0 g/dL 16.7 16.6  Hematocrit 39.0 - 52.0 % 48.9 46.9  Platelets 150 - 400 K/uL 165 192   CMP Latest Ref Rng & Units  11/24/2019 10/07/2017  Glucose 70 - 99 mg/dL 105(H) 99  BUN 6 - 20 mg/dL 23(H) 21  Creatinine 0.61 - 1.24 mg/dL 0.89 0.87  Sodium 135 - 145 mmol/L 136 137  Potassium 3.5 - 5.1 mmol/L 3.8 4.2  Chloride 98 - 111 mmol/L 102 105  CO2 22 - 32 mmol/L 25 23  Calcium 8.9 - 10.3 mg/dL 9.3 9.7  Total Protein 6.5 - 8.1 g/dL 7.5 6.9  Total Bilirubin 0.3 - 1.2 mg/dL 2.7(H) 1.6(H)  Alkaline Phos 38 - 126 U/L 79 -  AST 15 - 41 U/L 23 32  ALT 0 - 44 U/L 22 27      Imaging: Radiology review: I have independently reviewed the images and, have now noted the report below.   Within last 24 hrs: CT Abdomen Pelvis W Contrast  Result Date: 11/24/2019 CLINICAL DATA:  Right lower quadrant pain EXAM: CT ABDOMEN AND PELVIS WITH CONTRAST TECHNIQUE: Multidetector CT imaging of the abdomen and pelvis was performed using the standard protocol following bolus administration of intravenous contrast. CONTRAST:  144mL OMNIPAQUE IOHEXOL 300 MG/ML  SOLN COMPARISON:  None. FINDINGS: Lower chest: No acute abnormality. Hepatobiliary: No focal liver abnormality is seen. No gallstones, gallbladder wall thickening, or biliary dilatation. Pancreas: Unremarkable Spleen: Unremarkable. Adrenals/Urinary Tract: Unremarkable. Stomach/Bowel: Appendix is dilated. There is a 1.1 x 0.5 cm appendicolith distally. Beyond this, there is a 1.1 x 1 x 0.8 cm fluid-filled area, which may reflect inflamed and distended distal appendix or a small contained perforation (series 2, image 60). There is infiltration of the surrounding mesentery with small volume free fluid. Bowel is otherwise normal in caliber. Vascular/Lymphatic: No significant vascular findings are present. No enlarged abdominal or pelvic lymph nodes. Reproductive: Unremarkable. Other: None. Musculoskeletal: No acute osseous abnormality. Chronic left spondylolysis at L5. IMPRESSION: Acute appendicitis with distal obstructing appendicolith. The tip of the appendix is either distended or there may  be a small contained perforation/abscess. These results were called by telephone at the time of interpretation on 11/24/2019 at 1:00 pm to provider Fredia Sorrow , who verbally acknowledged these results. Electronically Signed   By: Macy Mis M.D.   On: 11/24/2019 13:01    Assessment    Acute appendicitis.  There are no problems to display for this patient.   Plan    Laparoscopic appendectomy.  The risks, benefits, complications, treatment options, and expected outcomes were discussed with the patient. The treatment of antibiotics alone was discussed giving a 20% chance that this could fail and surgery would be necessary.  Also discussed continuing to the operating room for Laparoscopic Appendectomy.  The possibilities of  bleeding, recurrent infection, perforation of viscus, finding a normal appendix, the need for additional procedures, failure to diagnose a condition, conversion to open procedure and creating a complication requiring transfusion or further operations were discussed. The patient was given the opportunity to ask questions and have them answered.  Patient would like to proceed with  Laparoscopic Appendectomy and consent was obtained.  CBC Latest Ref Rng & Units 11/24/2019 10/07/2017  WBC 4.0 - 10.5 K/uL 15.9(H) 7.0  Hemoglobin 13.0 - 17.0 g/dL 89.3 73.4  Hematocrit 28.7 - 52.0 % 48.9 46.9  Platelets 150 - 400 K/uL 165 192     Campbell Lerner M.D., FACS 11/24/2019, 1:10 PM

## 2019-11-24 NOTE — Anesthesia Postprocedure Evaluation (Signed)
Anesthesia Post Note  Patient: Henry Castro  Procedure(s) Performed: APPENDECTOMY LAPAROSCOPIC (N/A )  Patient location during evaluation: PACU Anesthesia Type: General Level of consciousness: awake, awake and alert and oriented Pain management: pain level controlled Vital Signs Assessment: post-procedure vital signs reviewed and stable Respiratory status: spontaneous breathing and respiratory function stable Cardiovascular status: blood pressure returned to baseline Postop Assessment: no apparent nausea or vomiting Anesthetic complications: no     Last Vitals:  Vitals:   11/24/19 1624 11/24/19 1633  BP:  113/70  Pulse: (!) 102 (!) 101  Resp: 12 18  Temp:  37.9 C  SpO2: 94% 95%    Last Pain:  Vitals:   11/24/19 1633  TempSrc: Oral  PainSc: 8                  Fauna Neuner C Deannah Rossi

## 2019-11-24 NOTE — ED Triage Notes (Signed)
Pt presents with co/ lower abdominal pain that radiates to the right, pt states he had fever of 102 last night and has had diarrhea yet feels bloated. Pt seen at urgent care and advised to get an ED eval.

## 2019-11-24 NOTE — ED Notes (Signed)
Pt here for eval of RLQ pain after visit to urgent care   Pain for several days   Last meal last night 2000 of chicken   Nothing since

## 2019-11-24 NOTE — ED Notes (Signed)
To CT

## 2019-11-24 NOTE — Op Note (Signed)
Laparascopic appendectomy   [name] Date of operation:  11/24/2019  Indications: The patient presented with a history of  abdominal pain. Workup has revealed findings consistent with acute appendicitis.  Pre-operative Diagnosis: Acute appendicitis without mention of peritonitis  Post-operative Diagnosis: Same  Surgeon: Campbell Lerner, M.D., FACS  Anesthesia: General with endotracheal tube  Findings: Exudative appendicitis w/o perforation, with adjacent sigmoid and omental walling off.    Estimated Blood Loss: 5 mL         Specimens: appendix         Complications:  None.   Procedure Details  The patient was seen again in the preop area. The options of surgery versus observation were reviewed with the patient and/or family. The risks of bleeding, infection, recurrence of symptoms, negative laparoscopy, potential for an open procedure, bowel injury, abscess or infection, were all reviewed as well. The patient was taken to Operating Room, identified as Henry Castro and the procedure verified as laparoscopic appendectomy. A Time Out was held and the above information confirmed. The patient was placed in the supine position and general anesthesia was induced.  Antibiotic prophylaxis was administered pre-op and VT E prophylaxis was in place.   The abdomen was prepped and draped in a sterile fashion. Local infiltration with 0.25% Marcaine with epi is administered to all incisions.  An infraumbilical incision was made.  A towel grasper is applied for countertraction, and a 5 mm optical trocar was passed into the peritoneal cavity under direct visualization.  Pneumoperitoneum obtained. One 12 mm port in the LLQ, and another 5 mm port were placed under direct visualization.  The appendix was identified, after blunt dissection to isolate it from the adjacent sigmoid colon, and the omentum.  Some peritoneal fluid present, w/o discoloration, or pus.  A small focus of adherent fibrinous  exudate on the serosa of the sigmoid.  The appendix was carefully dissected. The mesoappendix was divided with Harmonic scalpel. The base of the appendix was dissected out and divided with a 45 mm white load Endo GIA.The appendix was placed in a Endo Catch bag and removed via the 12 mm LLQ port. The right lower quadrant and pelvis was then irrigated with 3 liters of normal saline which was aspirated. Inspection  failed to identify any additional bleeding and there were no signs of bowel injury. Again the right lower quadrant was inspected there was no sign of bleeding or bowel injury. The LLQ fascia was closed with 0 Vicryl under direct visualization after pneumoperitoneum was released, all ports were removed, the LLQ port site was also  Irrigated prior to closure, and the skin incisions were approximated with subcuticular 4-0 Monocryl. Dermabond was applied.   The patient tolerated the procedure well, there were no complications. The sponge lap and needle count were correct at the end of the procedure.  The patient was taken to the recovery room in stable condition, fever noted, and to be discharged to home, with PO abx and pain medications.   Campbell Lerner, M.D., FACS 11/24/2019 - 3:52 PM

## 2019-11-24 NOTE — Transfer of Care (Signed)
Immediate Anesthesia Transfer of Care Note  Patient: [name]  Procedure(s) Performed: APPENDECTOMY LAPAROSCOPIC (N/A )  Patient Location: PACU  Anesthesia Type:General  Level of Consciousness: awake, alert , oriented and sedated  Airway & Oxygen Therapy: Patient Spontanous Breathing and Patient connected to nasal cannula oxygen  Post-op Assessment: Post -op Vital signs reviewed and stable  Post vital signs: Reviewed and stable  Last Vitals:  Vitals Value Taken Time  BP 123/62 11/24/19 1541  Temp 38.1 C 11/24/19 1541  Pulse 114 11/24/19 1541  Resp 14 11/24/19 1541  SpO2 100 % 11/24/19 1542  Vitals shown include unvalidated device data.  Last Pain:  Vitals:   11/24/19 1541  TempSrc:   PainSc: 0-No pain         Complications: No apparent anesthesia complications

## 2019-11-24 NOTE — Anesthesia Procedure Notes (Signed)
Procedure Name: Intubation Date/Time: 11/24/2019 2:27 PM Performed by: Denese Killings, MD Pre-anesthesia Checklist: Patient identified, Emergency Drugs available, Suction available and Patient being monitored Patient Re-evaluated:Patient Re-evaluated prior to induction Oxygen Delivery Method: Circle system utilized Preoxygenation: Pre-oxygenation with 100% oxygen Induction Type: IV induction and Rapid sequence Ventilation: Mask ventilation without difficulty Laryngoscope Size: Mac and 3 Grade View: Grade I Tube type: Oral Tube size: 7.5 mm Number of attempts: 1 Airway Equipment and Method: Stylet and Oral airway Placement Confirmation: ETT inserted through vocal cords under direct vision,  positive ETCO2 and breath sounds checked- equal and bilateral Secured at: 23 cm Tube secured with: Tape Dental Injury: Teeth and Oropharynx as per pre-operative assessment

## 2019-11-27 LAB — SURGICAL PATHOLOGY

## 2019-12-06 ENCOUNTER — Encounter: Payer: Self-pay | Admitting: Surgery

## 2019-12-06 ENCOUNTER — Other Ambulatory Visit: Payer: Self-pay

## 2019-12-06 ENCOUNTER — Ambulatory Visit (INDEPENDENT_AMBULATORY_CARE_PROVIDER_SITE_OTHER): Payer: Self-pay | Admitting: Surgery

## 2019-12-06 VITALS — BP 144/80 | HR 86 | Temp 97.9°F | Resp 12 | Ht 67.0 in | Wt 155.0 lb

## 2019-12-06 DIAGNOSIS — Z9049 Acquired absence of other specified parts of digestive tract: Secondary | ICD-10-CM

## 2019-12-06 NOTE — Patient Instructions (Addendum)
Follow-up with our office as needed. Please call and ask to speak with a nurse if you develop questions or concerns.   GENERAL POST-OPERATIVE PATIENT INSTRUCTIONS   WOUND CARE INSTRUCTIONS:  Keep a dry clean dressing on the wound if there is drainage. The initial bandage may be removed after 24 hours.  Once the wound has quit draining you may leave it open to air.  If clothing rubs against the wound or causes irritation and the wound is not draining you may cover it with a dry dressing during the daytime.  Try to keep the wound dry and avoid ointments on the wound unless directed to do so.  If the wound becomes bright red and painful or starts to drain infected material that is not clear, please contact your physician immediately.  If the wound is mildly pink and has a thick firm ridge underneath it, this is normal, and is referred to as a healing ridge.  This will resolve over the next 4-6 weeks.  BATHING: You may shower if you have been informed of this by your surgeon. However, Please do not submerge in a tub, hot tub, or pool until incisions are completely sealed or have been told by your surgeon that you may do so.  DIET:  You may eat any foods that you can tolerate.  It is a good idea to eat a high fiber diet and take in plenty of fluids to prevent constipation.  If you do become constipated you may want to take a mild laxative or take ducolax tablets on a daily basis until your bowel habits are regular.  Constipation can be very uncomfortable, along with straining, after recent surgery.  ACTIVITY:  You are encouraged to cough and deep breath or use your incentive spirometer if you were given one, every 15-30 minutes when awake.  This will help prevent respiratory complications and low grade fevers post-operatively if you had a general anesthetic.  You may want to hug a pillow when coughing and sneezing to add additional support to the surgical area, if you had abdominal or chest surgery, which  will decrease pain during these times.  You are encouraged to walk and engage in light activity for the next two weeks.  You should not lift more than 20 pounds, until 12/22/2019 as it could put you at increased risk for complications.  Twenty pounds is roughly equivalent to a plastic bag of groceries. At that time- Listen to your body when lifting, if you have pain when lifting, stop and then try again in a few days. Soreness after doing exercises or activities of daily living is normal as you get back in to your normal routine.  MEDICATIONS:  Try to take narcotic medications and anti-inflammatory medications, such as tylenol, ibuprofen, naprosyn, etc., with food.  This will minimize stomach upset from the medication.  Should you develop nausea and vomiting from the pain medication, or develop a rash, please discontinue the medication and contact your physician.  You should not drive, make important decisions, or operate machinery when taking narcotic pain medication.  SUNBLOCK Use sun block to incision area over the next year if this area will be exposed to sun. This helps decrease scarring and will allow you avoid a permanent darkened area over your incision.  QUESTIONS:  Please feel free to call our office if you have any questions, and we will be glad to assist you.

## 2019-12-06 NOTE — Progress Notes (Signed)
[facility] POST-OP OFFICE VISIT  12/06/2019  HPI: Henry Castro is a 34 y.o. male 12 days s/p laparoscopic appendectomy for suppurative appendicitis. This gentleman is from [place].  He denies fevers chills nausea and vomiting.  He reports a good appetite with normal bowel movements.  He does report some bloating, we discussed the effect of oral antibiotics that may benefit from replenishment of healthy colonic flora.  He has no concerns.  Vital signs: BP (!) 144/80   Pulse 86   Temp 97.9 F (36.6 C)   Resp 12   Ht 5\' 7"  (1.702 m)   Wt 155 lb (70.3 kg)   SpO2 98%   BMI 24.28 kg/m    Physical Exam: Constitutional: Appears well, bright and cheerful. Abdomen: Soft and nontender. Skin: Incisions are clean/dry/intact with Dermabond intact.  Assessment/Plan: This is a 34 y.o. male 12 days s/p laparoscopic appendectomy.  There are no problems to display for this patient.   -I do not anticipate any future issues, will be happy to see him again in follow-up and we will make ourselves readily available for any phone consultation and save him a trip from [place].  Recommended he gradually increase his activities as he feels comfortable and reported he should be at full strength without restrictions in the next 4 weeks.   G[place] M.D., FACS 12/06/2019, 11:13 AM

## 2020-06-13 ENCOUNTER — Other Ambulatory Visit: Payer: Self-pay

## 2020-06-13 DIAGNOSIS — Z20822 Contact with and (suspected) exposure to covid-19: Secondary | ICD-10-CM

## 2020-06-14 LAB — NOVEL CORONAVIRUS, NAA: SARS-CoV-2, NAA: DETECTED — AB

## 2020-06-14 LAB — SARS-COV-2, NAA 2 DAY TAT

## 2020-06-15 ENCOUNTER — Telehealth: Payer: Self-pay | Admitting: Physician Assistant

## 2020-06-15 ENCOUNTER — Telehealth: Payer: Self-pay | Admitting: Nurse Practitioner

## 2020-06-15 ENCOUNTER — Telehealth (HOSPITAL_COMMUNITY): Payer: Self-pay | Admitting: Family

## 2020-06-15 ENCOUNTER — Other Ambulatory Visit (HOSPITAL_COMMUNITY): Payer: Self-pay | Admitting: Family

## 2020-06-15 DIAGNOSIS — R69 Illness, unspecified: Secondary | ICD-10-CM

## 2020-06-15 DIAGNOSIS — U071 COVID-19: Secondary | ICD-10-CM

## 2020-06-15 NOTE — Progress Notes (Signed)
I connected by phone with Henry Castro on 06/15/2020 at 7:22 PM to discuss the potential use of a new treatment for mild to moderate COVID-19 viral infection in non-hospitalized patients.  This patient is a 34 y.o. male that meets the FDA criteria for Emergency Use Authorization of COVID monoclonal antibody casirivimab/imdevimab or bamlanivimab/eteseviamb.  Has a (+) direct SARS-CoV-2 viral test result  Has mild or moderate COVID-19   Is NOT hospitalized due to COVID-19  Is within 10 days of symptom onset  Has at least one of the high risk factor(s) for progression to severe COVID-19 and/or hospitalization as defined in EUA.  Specific high risk criteria : Other high risk medical condition per CDC:  High risk group   Symptoms of fever, weakness, cough began 06/12/20.   I have spoken and communicated the following to the patient or parent/caregiver regarding COVID monoclonal antibody treatment:  1. FDA has authorized the emergency use for the treatment of mild to moderate COVID-19 in adults and pediatric patients with positive results of direct SARS-CoV-2 viral testing who are 49 years of age and older weighing at least 40 kg, and who are at high risk for progressing to severe COVID-19 and/or hospitalization.  2. The significant known and potential risks and benefits of COVID monoclonal antibody, and the extent to which such potential risks and benefits are unknown.  3. Information on available alternative treatments and the risks and benefits of those alternatives, including clinical trials.  4. Patients treated with COVID monoclonal antibody should continue to self-isolate and use infection control measures (e.g., wear mask, isolate, social distance, avoid sharing personal items, clean and disinfect "high touch" surfaces, and frequent handwashing) according to CDC guidelines.   5. The patient or parent/caregiver has the option to accept or refuse COVID monoclonal antibody  treatment.  After reviewing this information with the patient, the patient has agreed to receive one of the available covid 19 monoclonal antibodies and will be provided an appropriate fact sheet prior to infusion. Morton Stall, NP 06/15/2020 7:22 PM

## 2020-06-15 NOTE — Telephone Encounter (Signed)
Called patient to discuss Covid symptoms and the use of casirivimab/imdevimab, a monoclonal antibody infusion for those with mild to moderate Covid symptoms and at a high risk of hospitalization.  Pt is qualified for this infusion at the [facility] due to; Specific high risk criteria : Other high risk medical condition per CDC:  High risk group ° ° °Message left to call back our hotline [phone]. ° °Mari Battaglia, NP °  °

## 2020-06-15 NOTE — Telephone Encounter (Signed)
Called to Discuss with patient about Covid symptoms and the use of the monoclonal antibody infusion for those with mild to moderate Covid symptoms and at a high risk of hospitalization.     Pt appears to qualify for this infusion due to co-morbid conditions and/or a member of an at-risk group in accordance with the FDA Emergency Use Authorization.    Unable to reach pt. Left voice mail to call back.   

## 2020-06-15 NOTE — Telephone Encounter (Signed)
Previous call placed by a member of [facility] team, to discuss with [name] about Covid symptoms and potential candidacy for the use of casirivimab/imdevimab, a combination monoclonal antibody infusion for those with mild to moderate Covid symptoms and at a high risk of hospitalization. Patient called back and left message.     Attempted to return call to the patient, however unable to reach patient. VM left.   Zanyia Silbaugh,NP

## 2020-06-17 ENCOUNTER — Ambulatory Visit (HOSPITAL_COMMUNITY)
Admission: RE | Admit: 2020-06-17 | Discharge: 2020-06-17 | Disposition: A | Discharge status: Home / Self Care | Payer: HRSA Program | Source: Ambulatory Visit | Attending: Pulmonary Disease | Admitting: Pulmonary Disease

## 2020-06-17 DIAGNOSIS — U071 COVID-19: Secondary | ICD-10-CM | POA: Diagnosis present

## 2020-06-17 MED ORDER — DIPHENHYDRAMINE HCL 50 MG/ML IJ SOLN: 50.0000 mg | Freq: Once | INTRAMUSCULAR | Status: DC | PRN

## 2020-06-17 MED ORDER — SOTROVIMAB 500 MG/8ML IV SOLN
500.0000 mg | Freq: Once | INTRAVENOUS | Status: AC
  Administered 2020-06-17: 500 mg via INTRAVENOUS

## 2020-06-17 MED ORDER — EPINEPHRINE 0.3 MG/0.3ML IJ SOAJ: 0.3000 mg | Freq: Once | INTRAMUSCULAR | Status: DC | PRN

## 2020-06-17 MED ORDER — SODIUM CHLORIDE 0.9 % IV SOLN: Freq: Once | INTRAVENOUS | Status: DC

## 2020-06-17 MED ORDER — METHYLPREDNISOLONE SODIUM SUCC 125 MG IJ SOLR: 125.0000 mg | Freq: Once | INTRAMUSCULAR | Status: DC | PRN

## 2020-06-17 MED ORDER — ALBUTEROL SULFATE HFA 108 (90 BASE) MCG/ACT IN AERS: 2.0000 | INHALATION_SPRAY | Freq: Once | RESPIRATORY_TRACT | Status: DC | PRN

## 2020-06-17 MED ORDER — FAMOTIDINE IN NACL 20-0.9 MG/50ML-% IV SOLN: 20.0000 mg | Freq: Once | INTRAVENOUS | Status: DC | PRN

## 2020-06-17 MED ORDER — SODIUM CHLORIDE 0.9 % IV SOLN: INTRAVENOUS | Status: DC | PRN

## 2020-06-17 NOTE — Progress Notes (Signed)
  Diagnosis: COVID-19  Physician: Dr. Delford Field  Procedure: [facility] Infusion [facility] Med: Sotrovimab infusion - Provided patient with Sotrovimab fact sheet for patients, parents and caregivers prior to infusion.   Complications: No immediate complications  Discharge: Discharged home   [facility] 06/17/2020

## 2020-06-17 NOTE — Discharge Instructions (Signed)

## 2021-06-24 DIAGNOSIS — Z13220 Encounter for screening for lipoid disorders: Secondary | ICD-10-CM | POA: Diagnosis not present

## 2021-06-24 DIAGNOSIS — Z23 Encounter for immunization: Secondary | ICD-10-CM | POA: Diagnosis not present

## 2021-06-24 DIAGNOSIS — Z0000 Encounter for general adult medical examination without abnormal findings: Secondary | ICD-10-CM | POA: Diagnosis not present

## 2021-08-04 DIAGNOSIS — Z23 Encounter for immunization: Secondary | ICD-10-CM | POA: Diagnosis not present

## 2021-08-25 IMAGING — DX DG FOOT COMPLETE 3+V*R*
3 series · 3 of 3 positions shown · non-contrast
Comparison: None.

CLINICAL DATA: Dropped weight plate on foot, pain and swelling of
great toe

EXAM:
RIGHT FOOT COMPLETE - 3+ VIEW

[foot ap]
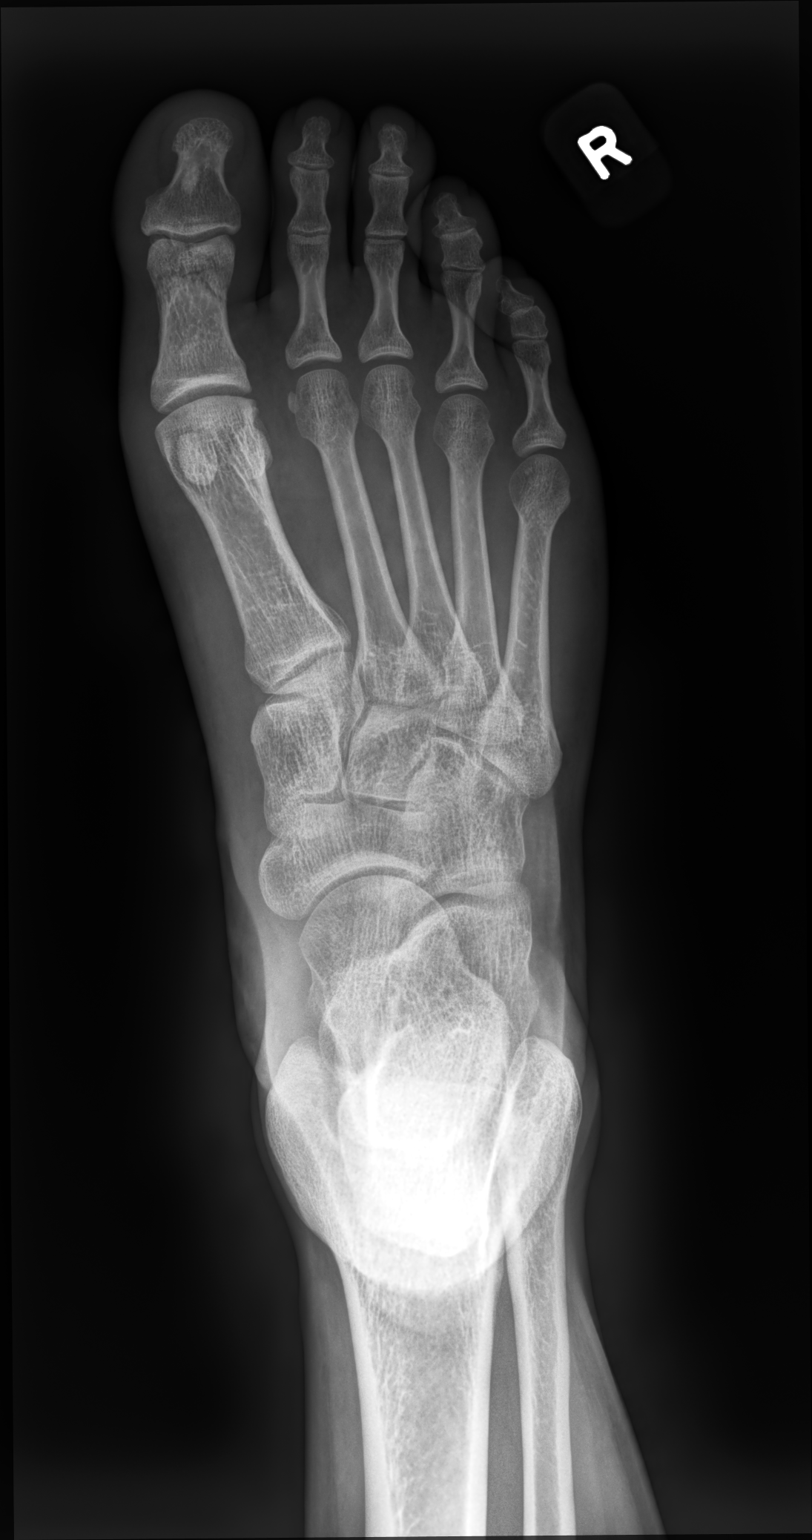

[foot mlo]
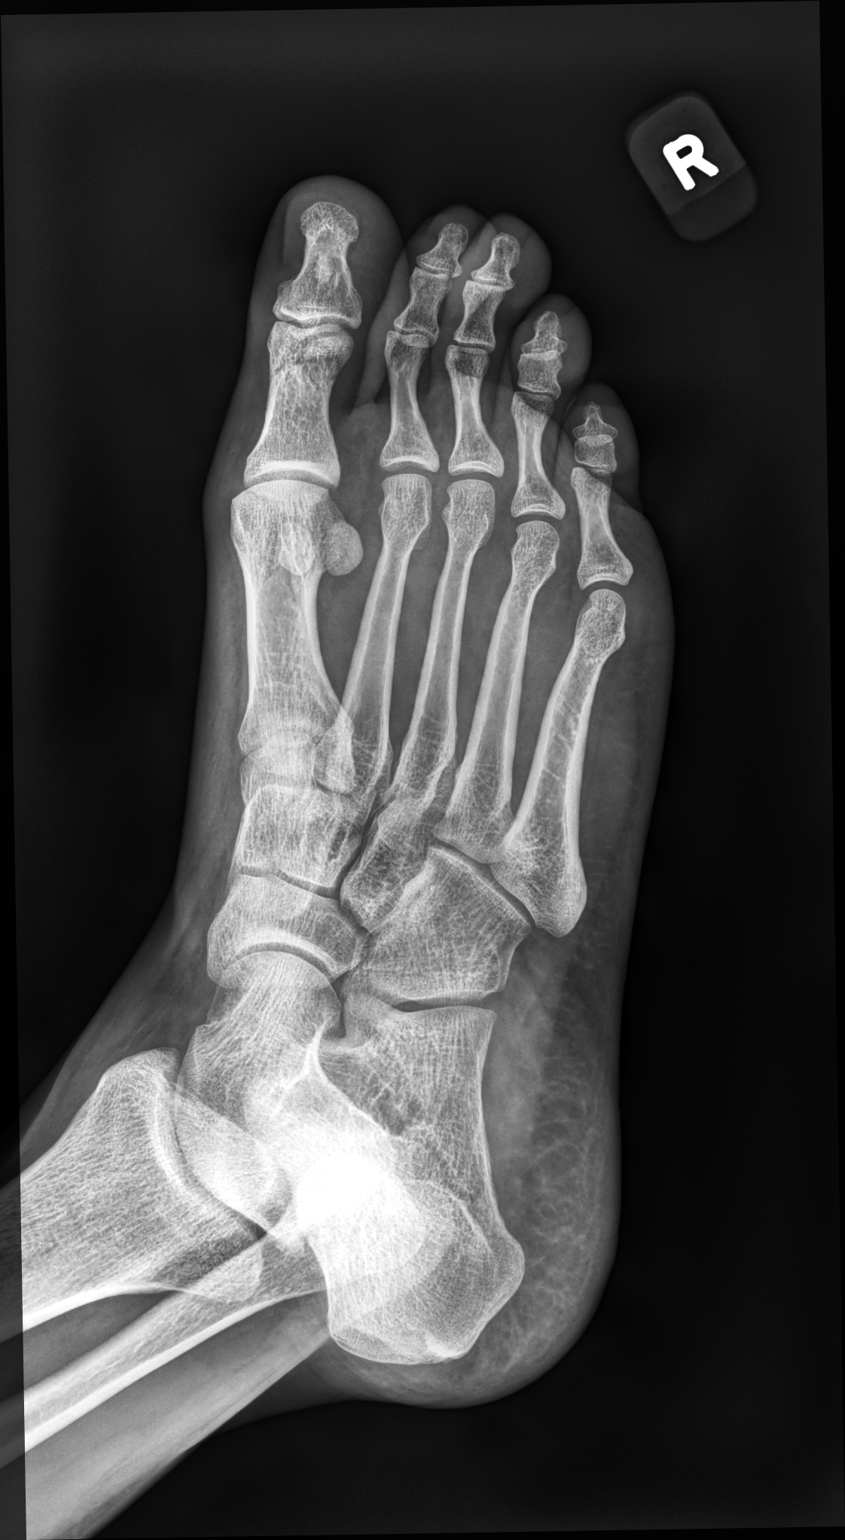

[foot lat]
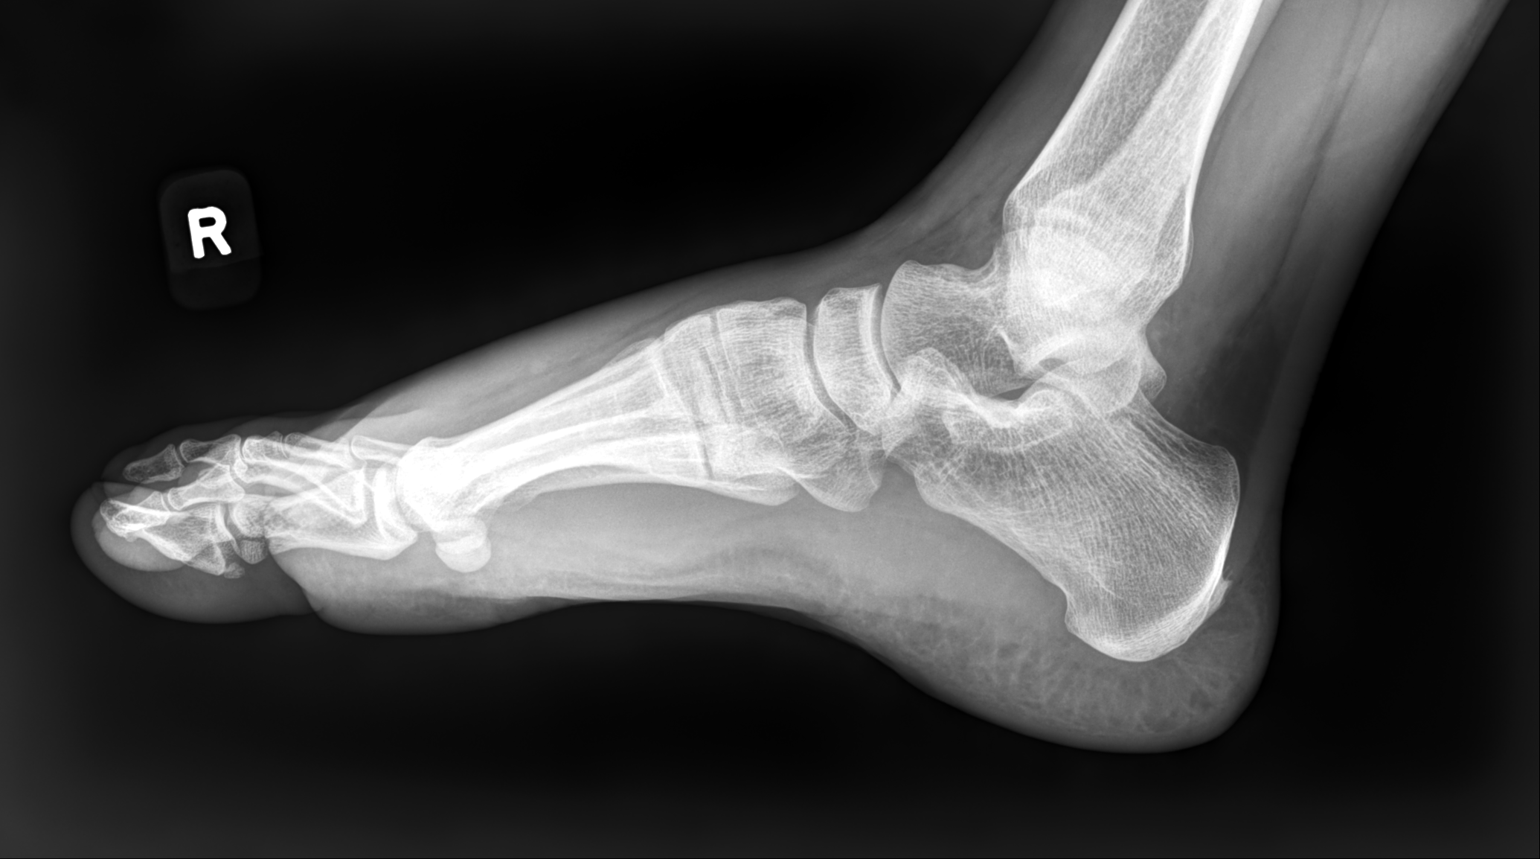

[3 of 3 positions shown; findings below may reference images not displayed]

FINDINGS: There is a nondisplaced, intra-articular unicondylar fracture of the
lateral aspect of the great toe proximal phalanx. No other fracture
or dislocation of the right foot. Joint spaces are preserved.
Extensive soft tissue edema about the forefoot.
IMPRESSION: Nondisplaced, intra-articular unicondylar fracture of the lateral
aspect of the great toe proximal phalanx.

## 2022-07-06 DIAGNOSIS — M7918 Myalgia, other site: Secondary | ICD-10-CM | POA: Diagnosis not present

## 2022-07-06 DIAGNOSIS — Z13220 Encounter for screening for lipoid disorders: Secondary | ICD-10-CM | POA: Diagnosis not present

## 2022-07-06 DIAGNOSIS — Z0000 Encounter for general adult medical examination without abnormal findings: Secondary | ICD-10-CM | POA: Diagnosis not present

## 2022-08-25 DIAGNOSIS — L959 Vasculitis limited to the skin, unspecified: Secondary | ICD-10-CM | POA: Diagnosis not present

## 2022-08-25 DIAGNOSIS — D1801 Hemangioma of skin and subcutaneous tissue: Secondary | ICD-10-CM | POA: Diagnosis not present

## 2022-09-15 DIAGNOSIS — D7589 Other specified diseases of blood and blood-forming organs: Secondary | ICD-10-CM | POA: Diagnosis not present

## 2022-09-15 DIAGNOSIS — E7800 Pure hypercholesterolemia, unspecified: Secondary | ICD-10-CM | POA: Diagnosis not present

## 2023-08-04 DIAGNOSIS — Z1159 Encounter for screening for other viral diseases: Secondary | ICD-10-CM | POA: Diagnosis not present

## 2023-08-04 DIAGNOSIS — Z13220 Encounter for screening for lipoid disorders: Secondary | ICD-10-CM | POA: Diagnosis not present

## 2023-08-04 DIAGNOSIS — Z0000 Encounter for general adult medical examination without abnormal findings: Secondary | ICD-10-CM | POA: Diagnosis not present

## 2023-08-04 DIAGNOSIS — Z113 Encounter for screening for infections with a predominantly sexual mode of transmission: Secondary | ICD-10-CM | POA: Diagnosis not present

## 2023-12-14 DIAGNOSIS — M9901 Segmental and somatic dysfunction of cervical region: Secondary | ICD-10-CM | POA: Diagnosis not present

## 2023-12-14 DIAGNOSIS — M546 Pain in thoracic spine: Secondary | ICD-10-CM | POA: Diagnosis not present

## 2023-12-14 DIAGNOSIS — M9902 Segmental and somatic dysfunction of thoracic region: Secondary | ICD-10-CM | POA: Diagnosis not present

## 2023-12-14 DIAGNOSIS — M542 Cervicalgia: Secondary | ICD-10-CM | POA: Diagnosis not present
